# Patient Record
Sex: Male | Born: 1950
Health system: Southern US, Community
[De-identification: ages and names within clinical notes are randomized; demographics above are authoritative.]

## PROBLEM LIST (undated history)

## (undated) DIAGNOSIS — C61 Malignant neoplasm of prostate: Secondary | ICD-10-CM

## (undated) DIAGNOSIS — E7800 Pure hypercholesterolemia, unspecified: Secondary | ICD-10-CM

## (undated) DIAGNOSIS — F0390 Unspecified dementia without behavioral disturbance: Secondary | ICD-10-CM

## (undated) DIAGNOSIS — R972 Elevated prostate specific antigen [PSA]: Secondary | ICD-10-CM

## (undated) HISTORY — PX: PROSTATE BIOPSY: SHX241

## (undated) HISTORY — PX: TONSILLECTOMY: SUR1361

---

## 2000-08-18 ENCOUNTER — Ambulatory Visit (HOSPITAL_COMMUNITY): Admission: RE | Admit: 2000-08-18 | Discharge: 2000-08-18 | Payer: Self-pay | Admitting: Gastroenterology

## 2005-01-23 ENCOUNTER — Ambulatory Visit (HOSPITAL_BASED_OUTPATIENT_CLINIC_OR_DEPARTMENT_OTHER): Admission: RE | Admit: 2005-01-23 | Discharge: 2005-01-23 | Payer: Self-pay | Admitting: Surgery

## 2005-01-23 ENCOUNTER — Ambulatory Visit (HOSPITAL_COMMUNITY): Admission: RE | Admit: 2005-01-23 | Discharge: 2005-01-23 | Payer: Self-pay | Admitting: Surgery

## 2015-11-04 DIAGNOSIS — M25561 Pain in right knee: Secondary | ICD-10-CM | POA: Diagnosis not present

## 2016-01-15 DIAGNOSIS — E782 Mixed hyperlipidemia: Secondary | ICD-10-CM | POA: Diagnosis not present

## 2016-01-15 DIAGNOSIS — R7301 Impaired fasting glucose: Secondary | ICD-10-CM | POA: Diagnosis not present

## 2016-01-15 DIAGNOSIS — Z125 Encounter for screening for malignant neoplasm of prostate: Secondary | ICD-10-CM | POA: Diagnosis not present

## 2016-01-15 DIAGNOSIS — R05 Cough: Secondary | ICD-10-CM | POA: Diagnosis not present

## 2016-01-15 DIAGNOSIS — Z0000 Encounter for general adult medical examination without abnormal findings: Secondary | ICD-10-CM | POA: Diagnosis not present

## 2016-01-15 DIAGNOSIS — Z23 Encounter for immunization: Secondary | ICD-10-CM | POA: Diagnosis not present

## 2016-01-15 DIAGNOSIS — Z131 Encounter for screening for diabetes mellitus: Secondary | ICD-10-CM | POA: Diagnosis not present

## 2016-01-16 ENCOUNTER — Other Ambulatory Visit: Payer: Self-pay | Admitting: Physician Assistant

## 2016-01-16 DIAGNOSIS — Z87891 Personal history of nicotine dependence: Secondary | ICD-10-CM

## 2016-02-03 ENCOUNTER — Ambulatory Visit
Admission: RE | Admit: 2016-02-03 | Discharge: 2016-02-03 | Disposition: A | Discharge status: Home / Self Care | Payer: Medicare Other | Source: Ambulatory Visit | Attending: Physician Assistant | Admitting: Physician Assistant

## 2016-02-03 DIAGNOSIS — Z87891 Personal history of nicotine dependence: Secondary | ICD-10-CM | POA: Diagnosis not present

## 2016-02-03 DIAGNOSIS — Z136 Encounter for screening for cardiovascular disorders: Secondary | ICD-10-CM | POA: Diagnosis not present

## 2017-01-05 ENCOUNTER — Ambulatory Visit (INDEPENDENT_AMBULATORY_CARE_PROVIDER_SITE_OTHER): Payer: Medicare Other

## 2017-01-05 ENCOUNTER — Ambulatory Visit (INDEPENDENT_AMBULATORY_CARE_PROVIDER_SITE_OTHER): Payer: Medicare Other | Admitting: Orthopaedic Surgery

## 2017-01-05 ENCOUNTER — Encounter (INDEPENDENT_AMBULATORY_CARE_PROVIDER_SITE_OTHER): Payer: Self-pay | Admitting: Orthopaedic Surgery

## 2017-01-05 VITALS — BP 121/76 | HR 83 | Resp 14 | Ht 69.0 in | Wt 165.0 lb

## 2017-01-05 DIAGNOSIS — G8929 Other chronic pain: Secondary | ICD-10-CM

## 2017-01-05 DIAGNOSIS — M25562 Pain in left knee: Secondary | ICD-10-CM | POA: Diagnosis not present

## 2017-01-05 DIAGNOSIS — M25561 Pain in right knee: Secondary | ICD-10-CM

## 2017-01-05 MED ORDER — BUPIVACAINE HCL 0.5 % IJ SOLN
3.0000 mL | INTRAMUSCULAR | Status: AC | PRN
  Administered 2017-01-05: 3 mL via INTRA_ARTICULAR

## 2017-01-05 MED ORDER — METHYLPREDNISOLONE ACETATE 40 MG/ML IJ SUSP
80.0000 mg | INTRAMUSCULAR | Status: AC | PRN
  Administered 2017-01-05: 80 mg

## 2017-01-05 MED ORDER — LIDOCAINE HCL 1 % IJ SOLN
5.0000 mL | INTRAMUSCULAR | Status: AC | PRN
  Administered 2017-01-05: 5 mL

## 2017-01-05 NOTE — Progress Notes (Signed)
Office Visit Note   Patient: Brian Greene           Date of Birth: 07/20/1950           MRN: [id number] Visit Date: 01/05/2017              Requested by: No referring provider defined for this encounter. PCP: Orpah Melter, MD   Assessment & Plan: Visit Diagnoses:  1. Chronic pain of left knee   the plate could be a combination of early osteoarthritis medial compartment left knee versus combination of arthritis and tear of the medial meniscus  Plan: long discussion regarding different diagnostic possibilities. Cortisone injection medial compartment left knee and monitor  Response.if no improvement consider MRI scan  Follow-Up Instructions: No Follow-up on file.   Orders:  Orders Placed This Encounter  Procedures  . XR KNEE 3 VIEW LEFT   No orders of the defined types were placed in this encounter.     Procedures: Large Joint Inj Date/Time: 01/05/2017 5:31 PM Performed by: Garald Balding Authorized by: Garald Balding   Consent Given by:  Patient Timeout: prior to procedure the correct patient, procedure, and site was verified   Indications:  Pain and joint swelling Location:  Knee Site:  L knee Prep: patient was prepped and draped in usual sterile fashion   Needle Size:  25 G Needle Length:  1.5 inches Approach:  Anteromedial Ultrasound Guidance: No   Fluoroscopic Guidance: No   Arthrogram: No   Medications:  5 mL lidocaine 1 %; 80 mg methylPREDNISolone acetate 40 MG/ML; 3 mL bupivacaine 0.5 % Aspiration Attempted: No   Patient tolerance:  Patient tolerated the procedure well with no immediate complications     Clinical Data: No additional findings.   Subjective: Chief Complaint  Patient presents with  . Left Knee - Injury, Pain, Edema    Brian Greene is a 66 y o that presents with Left knee pain. He relates since went off a diving board July 5,2018 he has been in pain.swollen, slightly red  initial onset of pain after a twisting injury. He's  had persistent pain for the past 2 to a half months predominantly along the medial compartment of his left knee. He's been experiencing achiness soreness and even an effusion. He's had a limp and some compromise of his activities particularly with bending and stooping. He's not had any locking. Has not had any back pain or groin discomfort. No numbness or tingling.  HPI  Review of Systems  Constitutional: Negative for fatigue.  HENT: Negative for hearing loss.   Respiratory: Positive for cough. Negative for apnea, chest tightness and shortness of breath.   Cardiovascular: Negative for chest pain, palpitations and leg swelling.  Gastrointestinal: Negative for blood in stool, constipation and diarrhea.  Genitourinary: Negative for difficulty urinating.  Musculoskeletal: Negative for arthralgias, back pain, joint swelling, myalgias, neck pain and neck stiffness.  Neurological: Negative for weakness, numbness and headaches.  Hematological: Does not bruise/bleed easily.  Psychiatric/Behavioral: Negative for sleep disturbance. The patient is not nervous/anxious.      Objective: Vital Signs: BP 121/76   Pulse 83   Resp 14   Ht 5\' 9"  (1.753 m)   Wt 165 lb (74.8 kg)   BMI 24.37 kg/m   Physical Exam  Ortho Examawake alert and oriented 3 comfortable sitting position positive effusion left knee with predominantly medial joint pain. Pain is diffuse along the lateral compartment but predominantly posteriorly. No patella crepitation. No lateral  joint pain. No instability. No popliteal mass or pain. No calf pain. No distal edema. Neurovascular exam intact. Straight leg raise negative bilaterally. Painless rof motion both hips.  Specialty Comments:  No specialty comments available.  Imaging: Xr Knee 3 View Left  Result Date: 01/05/2017 Films of the left knee were obtained in several projections standing. There is minimal decrease in the medial joint space. No subchondral sclerosis. Minimal  osteophyte formation. Some patellofemoral arthritis but very minimal. No ectopic calcification. Normal alignment.    PMFS History: There are no active problems to display for this patient.  History reviewed. No pertinent past medical history.  Family History  Problem Relation Age of Onset  . Diabetes Mother     Past Surgical History:  Procedure Laterality Date  . TONSILLECTOMY     Social History   Occupational History  . Not on file.   Social History Main Topics  . Smoking status: Never Smoker  . Smokeless tobacco: Never Used  . Alcohol use Yes     Comment: occasionally  . Drug use: No  . Sexual activity: Not on file

## 2017-01-06 ENCOUNTER — Ambulatory Visit (INDEPENDENT_AMBULATORY_CARE_PROVIDER_SITE_OTHER): Payer: Self-pay | Admitting: Orthopedic Surgery

## 2017-02-19 ENCOUNTER — Telehealth: Payer: Self-pay

## 2017-02-19 NOTE — Telephone Encounter (Signed)
Patient's wife Melody called stating that patient would like to proceed with having an MRI done of his Left knee, due to injection not working.  CB# [phone].  Please advise. Thank You

## 2017-02-23 ENCOUNTER — Other Ambulatory Visit (INDEPENDENT_AMBULATORY_CARE_PROVIDER_SITE_OTHER): Payer: Self-pay

## 2017-02-23 DIAGNOSIS — M25562 Pain in left knee: Principal | ICD-10-CM

## 2017-02-23 DIAGNOSIS — G8929 Other chronic pain: Secondary | ICD-10-CM

## 2017-02-23 NOTE — Telephone Encounter (Signed)
sent referral

## 2017-03-07 ENCOUNTER — Ambulatory Visit
Admission: RE | Admit: 2017-03-07 | Discharge: 2017-03-07 | Disposition: A | Discharge status: Home / Self Care | Payer: Medicare Other | Source: Ambulatory Visit | Attending: Orthopaedic Surgery | Admitting: Orthopaedic Surgery

## 2017-03-07 DIAGNOSIS — M25562 Pain in left knee: Principal | ICD-10-CM

## 2017-03-07 DIAGNOSIS — G8929 Other chronic pain: Secondary | ICD-10-CM

## 2017-03-16 ENCOUNTER — Telehealth: Payer: Self-pay

## 2017-03-16 NOTE — Telephone Encounter (Signed)
Called results of MRI left knee

## 2017-03-16 NOTE — Telephone Encounter (Signed)
Please call.

## 2017-03-16 NOTE — Telephone Encounter (Signed)
Patient's wife Melody  would like a phone call concerning MRI results.  Cb# is [phone].  Please advise.  Thank you.

## 2018-04-04 ENCOUNTER — Ambulatory Visit: Payer: Medicare Other | Admitting: Podiatry

## 2018-04-04 ENCOUNTER — Encounter: Payer: Self-pay | Admitting: Podiatry

## 2018-04-04 VITALS — BP 136/81 | HR 63 | Resp 16

## 2018-04-04 DIAGNOSIS — L600 Ingrowing nail: Secondary | ICD-10-CM | POA: Diagnosis not present

## 2018-04-04 MED ORDER — MUPIROCIN 2 % EX OINT: 1.0000 "application " | TOPICAL_OINTMENT | Freq: Two times a day (BID) | CUTANEOUS | 2 refills | Status: DC

## 2018-04-04 NOTE — Patient Instructions (Signed)

## 2018-04-10 DIAGNOSIS — L600 Ingrowing nail: Secondary | ICD-10-CM | POA: Insufficient documentation

## 2018-04-10 NOTE — Progress Notes (Signed)
Subjective:   Patient ID: Brian Greene, male   DOB: 67 y.o.   MRN: [id number]   HPI 67 year old male presents the office today for concerns of ingrown toenails of the right big toe, lateral aspect which is ongoing for last couple of months is intermittent and pain.  He states that he had an injury to the nail about when he was 67 years old and he continues to get ingrown toenail since then he has a clip his toenails but does not limit the pain long-term.  Denies any drainage or pus at this time he has no other concerns.   Review of Systems  All other systems reviewed and are negative.  No past medical history on file.     Current Outpatient Medications:  .  mupirocin ointment (BACTROBAN) 2 %, Apply 1 application topically 2 (two) times daily., Disp: 30 g, Rfl: 2  Allergies  Allergen Reactions  . Penicillins Rash         Objective:  Physical Exam  General: AAO x3, NAD  Dermatological: Incurvation present the lateral aspect the right hallux toenail with localized edema to the area but there is no drainage of pus and there is no ascending cellulitis.  No fluctuation or crepitation.  Vascular: Dorsalis Pedis artery and Posterior Tibial artery pedal pulses are 2/4 bilateral with immedate capillary fill time. There is no pain with calf compression, swelling, warmth, erythema.   Neruologic: Grossly intact via light touch bilateral.. Protective threshold with Semmes Wienstein monofilament intact to all pedal sites bilateral.   Musculoskeletal: No gross boney pedal deformities bilateral. No pain, crepitus, or limitation noted with foot and ankle range of motion bilateral. Muscular strength 5/5 in all groups tested bilateral.  Gait: Unassisted, Nonantalgic.   Assessment:   Ingrown toenail right lateral hallux     Plan:  -Treatment options discussed including all alternatives, risks, and complications -Etiology of symptoms were discussed -At this time, the patient is requesting  partial nail removal with chemical matricectomy to the symptomatic portion of the nail. Risks and complications were discussed with the patient for which they understand and written consent was obtained. Under sterile conditions a total of 3 mL of a mixture of 2% lidocaine plain and 0.5% Marcaine plain was infiltrated in a hallux block fashion. Once anesthetized, the skin was prepped in sterile fashion. A tourniquet was then applied. Next the lateral aspect of hallux nail border was then sharply excised making sure to remove the entire offending nail border. Once the nails were ensured to be removed area was debrided and the underlying skin was intact. There is no purulence identified in the procedure. Next phenol was then applied under standard conditions and copiously irrigated. Silvadene was applied. A dry sterile dressing was applied. After application of the dressing the tourniquet was removed and there is found to be an immediate capillary refill time to the digit. The patient tolerated the procedure well any complications. Post procedure instructions were discussed the patient for which he verbally understood. Follow-up in one week for nail check or sooner if any problems are to arise. Discussed signs/symptoms of infection and directed to call the office immediately should any occur or go directly to the emergency room. In the meantime, encouraged to call the office with any questions, concerns, changes symptoms.  Trula Slade DPM

## 2018-04-11 ENCOUNTER — Ambulatory Visit (INDEPENDENT_AMBULATORY_CARE_PROVIDER_SITE_OTHER): Payer: Medicare Other | Admitting: Podiatry

## 2018-04-11 ENCOUNTER — Other Ambulatory Visit: Payer: Medicare Other

## 2018-04-11 DIAGNOSIS — L600 Ingrowing nail: Secondary | ICD-10-CM

## 2018-04-11 NOTE — Patient Instructions (Signed)

## 2018-04-19 NOTE — Progress Notes (Signed)
Subjective: m Brian Greene is a 67 y.o.  male returns to office today for follow up evaluation after having right hallux partial permanent nail avulsion performed.  He was soaking Epsom salts but is not in presents as regularly as he should.  He states he is doing great he is having no pain denies any drainage or pus.  Patient denies fevers, chills, nausea, vomiting. Denies any calf pain, chest pain, SOB.   Objective:  Vitals: Reviewed  General: Well developed, nourished, in no acute distress, alert and oriented x3   Dermatology: Skin is warm, dry and supple bilateral.  Right pleural hallux nail border appears to be clean, dry, with mild granular tissue and surrounding scab. There is no surrounding erythema, edema, drainage/purulence. The remaining nails appear unremarkable at this time. There are no other lesions or other signs of infection present.  Neurovascular status: Intact. No lower extremity swelling; No pain with calf compression bilateral.  Musculoskeletal: Notenderness to palpation of the lateral hallux nail fold. Muscular strength within normal limits bilateral.   Assesement and Plan: S/p partial nail avulsion, doing well.   -Continue soaking in epsom salts twice a day followed by antibiotic ointment and a band-aid. Can leave uncovered at night. Continue this until completely healed.  -If the area has not healed in 2 weeks, call the office for follow-up appointment, or sooner if any problems arise.  -Monitor for any signs/symptoms of infection. Call the office immediately if any occur or go directly to the emergency room. Call with any questions/concerns.  Brian Greene, DPM

## 2019-01-27 ENCOUNTER — Other Ambulatory Visit: Payer: Self-pay | Admitting: Nurse Practitioner

## 2019-01-27 DIAGNOSIS — I700 Atherosclerosis of aorta: Secondary | ICD-10-CM

## 2019-02-02 ENCOUNTER — Other Ambulatory Visit: Payer: Self-pay | Admitting: Nurse Practitioner

## 2019-02-02 ENCOUNTER — Ambulatory Visit
Admission: RE | Admit: 2019-02-02 | Discharge: 2019-02-02 | Disposition: A | Discharge status: Home / Self Care | Payer: Medicare Other | Source: Ambulatory Visit | Attending: Nurse Practitioner | Admitting: Nurse Practitioner

## 2019-02-02 DIAGNOSIS — I700 Atherosclerosis of aorta: Secondary | ICD-10-CM

## 2019-05-22 ENCOUNTER — Encounter: Payer: Self-pay | Admitting: *Deleted

## 2019-06-02 ENCOUNTER — Ambulatory Visit: Payer: Medicare Other | Admitting: Radiation Oncology

## 2019-06-02 ENCOUNTER — Other Ambulatory Visit: Payer: Self-pay | Admitting: Urology

## 2019-06-21 NOTE — Patient Instructions (Addendum)
DUE TO COVID-19 ONLY ONE VISITOR IS ALLOWED TO COME WITH YOU AND STAY IN THE WAITING ROOM ONLY DURING PRE OP AND PROCEDURE DAY OF SURGERY. THE 1 VISITOR MAY VISIT WITH YOU AFTER SURGERY IN YOUR PRIVATE ROOM DURING VISITING HOURS ONLY!  YOU NEED TO HAVE A COVID 19 TEST ON: 06/26/19 @ 2:45 pm , THIS TEST MUST BE DONE BEFORE SURGERY, COME  [address], [place] , [address].  ([place]) ONCE YOUR COVID TEST IS COMPLETED, PLEASE BEGIN THE QUARANTINE INSTRUCTIONS AS OUTLINED IN YOUR HANDOUT.                JONNATAN APPLEBY    Your procedure is scheduled on: 06/29/19   Report to [facility] Main  Entrance   Report to admitting: at 9:15 AM     Call this number if you have problems the morning of surgery [phone]    Remember:    [place], NO [place].     Take these medicines the morning of surgery with A SIP OF WATER:  N/A                You may not have any metal on your body including hair pins and              piercings  Do not wear jewelry, lotions, powders or perfumes, deodorant             Men may shave face and neck.   Do not bring valuables to the hospital. [place].  Contacts, dentures or bridgework may not be worn into surgery.  Leave suitcase in the car. After surgery it may be brought to your room.     Patients discharged the day of surgery will not be allowed to drive home. IF YOU ARE HAVING SURGERY AND GOING HOME THE SAME DAY, YOU MUST HAVE AN ADULT TO DRIVE YOU HOME AND BE WITH YOU FOR 24 HOURS. YOU MAY GO HOME BY TAXI OR UBER OR ORTHERWISE, BUT AN ADULT MUST ACCOMPANY YOU HOME AND STAY WITH YOU FOR 24 HOURS.  Name and phone number of your driver:  Special Instructions: N/A              Please read over the following fact sheets you were  given: _____________________________________________________________________             NO SOLID FOOD AFTER MIDNIGHT THE NIGHT PRIOR TO SURGERY. NOTHING BY MOUTH EXCEPT CLEAR LIQUIDS UNTIL: 8:00 am .   CLEAR LIQUID DIET   Foods Allowed                                                                     Foods Excluded  Coffee and tea, regular and decaf                             liquids that you cannot  Plain Jell-O any favor except red or purple  see through such as: Fruit ices (not with fruit pulp)                                     milk, soups, orange juice  Iced Popsicles                                    All solid food Carbonated beverages, regular and diet                                    Cranberry, grape and apple juices Sports drinks like Gatorade Lightly seasoned clear broth or consume(fat free) Sugar, honey syrup  Sample Menu Breakfast                                Lunch                                     Supper Cranberry juice                    Beef broth                            Chicken broth Jell-O                                     Grape juice                           Apple juice Coffee or tea                        Jell-O                                      Popsicle                                                Coffee or tea                        Coffee or tea  _____________________________________________________________________  [facility] - Preparing for Surgery Before surgery, you can play an important role.  Because skin is not sterile, your skin needs to be as free of germs as possible.  You can reduce the number of germs on your skin by washing with CHG (chlorahexidine gluconate) soap before surgery.  CHG is an antiseptic cleaner which kills germs and bonds with the skin to continue killing germs even after washing. Please DO NOT use if you have an allergy to CHG or antibacterial soaps.  If your skin  becomes reddened/irritated stop using the CHG and inform your nurse when you arrive at Short Stay. Do not shave (including legs and underarms)  for at least 48 hours prior to the first CHG shower.  You may shave your face/neck. Please follow these instructions carefully:  1.  Shower with CHG Soap the night before surgery and the  morning of Surgery.  2.  If you choose to wash your hair, wash your hair first as usual with your  normal  shampoo.  3.  After you shampoo, rinse your hair and body thoroughly to remove the  shampoo.                           4.  Use CHG as you would any other liquid soap.  You can apply chg directly  to the skin and wash                       Gently with a scrungie or clean washcloth.  5.  Apply the CHG Soap to your body ONLY FROM THE NECK DOWN.   Do not use on face/ open                           Wound or open sores. Avoid contact with eyes, ears mouth and genitals (private parts).                       Wash face,  Genitals (private parts) with your normal soap.             6.  Wash thoroughly, paying special attention to the area where your surgery  will be performed.  7.  Thoroughly rinse your body with warm water from the neck down.  8.  DO NOT shower/wash with your normal soap after using and rinsing off  the CHG Soap.                9.  Pat yourself dry with a clean towel.            10.  Wear clean pajamas.            11.  Place clean sheets on your bed the night of your first shower and do not  sleep with pets. Day of Surgery : Do not apply any lotions/deodorants the morning of surgery.  Please wear clean clothes to the hospital/surgery center.  FAILURE TO FOLLOW THESE INSTRUCTIONS MAY RESULT IN THE CANCELLATION OF YOUR SURGERY PATIENT SIGNATURE_________________________________  NURSE SIGNATURE__________________________________  ________________________________________________________________________   Adam Phenix  An incentive spirometer is a  tool that can help keep your lungs clear and active. This tool measures how well you are filling your lungs with each breath. Taking long deep breaths may help reverse or decrease the chance of developing breathing (pulmonary) problems (especially infection) following:  A long period of time when you are unable to move or be active. BEFORE THE PROCEDURE   If the spirometer includes an indicator to show your best effort, your nurse or respiratory therapist will set it to a desired goal.  If possible, sit up straight or lean slightly forward. Try not to slouch.  Hold the incentive spirometer in an upright position. INSTRUCTIONS FOR USE  1. Sit on the edge of your bed if possible, or sit up as far as you can in bed or on a chair. 2. Hold the incentive spirometer in an upright position. 3. Breathe out normally. 4. Place the mouthpiece in your mouth and seal your lips tightly  around it. 5. Breathe in slowly and as deeply as possible, raising the piston or the ball toward the top of the column. 6. Hold your breath for 3-5 seconds or for as long as possible. Allow the piston or ball to fall to the bottom of the column. 7. Remove the mouthpiece from your mouth and breathe out normally. 8. Rest for a few seconds and repeat Steps 1 through 7 at least 10 times every 1-2 hours when you are awake. Take your time and take a few normal breaths between deep breaths. 9. The spirometer may include an indicator to show your best effort. Use the indicator as a goal to work toward during each repetition. 10. After each set of 10 deep breaths, practice coughing to be sure your lungs are clear. If you have an incision (the cut made at the time of surgery), support your incision when coughing by placing a pillow or rolled up towels firmly against it. Once you are able to get out of bed, walk around indoors and cough well. You may stop using the incentive spirometer when instructed by your caregiver.  RISKS AND  COMPLICATIONS  Take your time so you do not get dizzy or light-headed.  If you are in pain, you may need to take or ask for pain medication before doing incentive spirometry. It is harder to take a deep breath if you are having pain. AFTER USE  Rest and breathe slowly and easily.  It can be helpful to keep track of a log of your progress. Your caregiver can provide you with a simple table to help with this. If you are using the spirometer at home, follow these instructions: [place] IF:   You are having difficultly using the spirometer.  You have trouble using the spirometer as often as instructed.  Your pain medication is not giving enough relief while using the spirometer.  You develop fever of 100.5 F (38.1 C) or higher. SEEK IMMEDIATE MEDICAL CARE IF:   You cough up bloody sputum that had not been present before.  You develop fever of 102 F (38.9 C) or greater.  You develop worsening pain at or near the incision site. MAKE SURE YOU:   Understand these instructions.  Will watch your condition.  Will get help right away if you are not doing well or get worse. Document Released: 08/17/2006 Document Revised: 06/29/2011 Document Reviewed: 10/18/2006 [facility] Patient Information 2014 [place], [place].   ________________________________________________________________________

## 2019-06-23 ENCOUNTER — Other Ambulatory Visit: Payer: Self-pay

## 2019-06-23 ENCOUNTER — Encounter (HOSPITAL_COMMUNITY)
Admission: RE | Admit: 2019-06-23 | Discharge: 2019-06-23 | Disposition: A | Discharge status: Home / Self Care | Payer: Medicare Other | Source: Ambulatory Visit | Attending: Urology | Admitting: Urology

## 2019-06-23 ENCOUNTER — Encounter (HOSPITAL_COMMUNITY): Payer: Self-pay

## 2019-06-23 DIAGNOSIS — Z20822 Contact with and (suspected) exposure to covid-19: Secondary | ICD-10-CM | POA: Diagnosis not present

## 2019-06-23 DIAGNOSIS — Z01812 Encounter for preprocedural laboratory examination: Secondary | ICD-10-CM | POA: Diagnosis present

## 2019-06-23 NOTE — Progress Notes (Signed)
RN attempted to call pt. Several times,pt. Did not answered.Message was left on answering machine.

## 2019-06-23 NOTE — Progress Notes (Signed)
PCP - Dr. Sammuel Cooper Cardiologist -   Chest x-ray -  EKG -  Stress Test -  ECHO -  Cardiac Cath -   Sleep Study -  CPAP -   Fasting Blood Sugar -  Checks Blood Sugar _____ times a day  Blood Thinner Instructions: Aspirin Instructions: Last Dose:  Anesthesia review:   Patient denies shortness of breath, fever, cough and chest pain at PAT appointment   Patient verbalized understanding of instructions that were given to them at the PAT appointment. Patient was also instructed that they will need to review over the PAT instructions again at home before surgery.

## 2019-06-26 ENCOUNTER — Encounter (HOSPITAL_COMMUNITY)
Admission: RE | Admit: 2019-06-26 | Discharge: 2019-06-26 | Disposition: A | Discharge status: Home / Self Care | Payer: Medicare Other | Source: Ambulatory Visit | Attending: Urology | Admitting: Urology

## 2019-06-26 ENCOUNTER — Other Ambulatory Visit (HOSPITAL_COMMUNITY)
Admission: RE | Admit: 2019-06-26 | Discharge: 2019-06-26 | Disposition: A | Discharge status: Home / Self Care | Payer: Medicare Other | Source: Ambulatory Visit | Attending: Urology | Admitting: Urology

## 2019-06-26 ENCOUNTER — Other Ambulatory Visit: Payer: Self-pay

## 2019-06-26 DIAGNOSIS — Z01812 Encounter for preprocedural laboratory examination: Secondary | ICD-10-CM | POA: Diagnosis not present

## 2019-06-26 LAB — BASIC METABOLIC PANEL
Anion gap: 6 (ref 5–15)
BUN: 11 mg/dL (ref 8–23)
CO2: 29 mmol/L (ref 22–32)
Calcium: 9.1 mg/dL (ref 8.9–10.3)
Chloride: 107 mmol/L (ref 98–111)
Creatinine, Ser: 1.08 mg/dL (ref 0.61–1.24)
GFR calc Af Amer: >60 mL/min (ref >60)
GFR calc non Af Amer: >60 mL/min (ref >60)
Glucose, Bld: 116 mg/dL — ABNORMAL HIGH (ref 70–99)
Potassium: 4.1 mmol/L (ref 3.5–5.1)
Sodium: 142 mmol/L (ref 135–145)

## 2019-06-26 LAB — CBC
HCT: 43.9 % (ref 39.0–52.0)
Hemoglobin: 14.3 g/dL (ref 13.0–17.0)
MCH: 29.7 pg (ref 26.0–34.0)
MCHC: 32.6 g/dL (ref 30.0–36.0)
MCV: 91.1 fL (ref 80.0–100.0)
Platelets: 233 10*3/uL (ref 150–400)
RBC: 4.82 MIL/uL (ref 4.22–5.81)
RDW: 12.5 % (ref 11.5–15.5)
WBC: 6.3 10*3/uL (ref 4.0–10.5)
nRBC: 0 % (ref 0.0–0.2)

## 2019-06-26 LAB — SARS CORONAVIRUS 2 (TAT 6-24 HRS): SARS Coronavirus 2: NEGATIVE

## 2019-06-26 LAB — ABO/RH: ABO/RH(D): O POS

## 2019-06-28 NOTE — H&P (Signed)
Prostate Cancer    Brian Greene is a 69 year old healthy gentleman who was found to have an elevated PSA of 5.06 prompting a TRUS biopsy of the prostate on 04/19/20. This demonstrated Gleason 3+4=7 adenocarcinoma of the prostate with 6 out of 12 biopsy cores positive for malignancy.   Family history: Uncle.   Imaging studies: None.   PMH: He has a history of hyperlipidemia.  PSH: No abdominal surgeries.   TNM stage: cT2a Nx Mx (right base firmness)  PSA: 5.06  Gleason score: 3+4=7 (Grade group 2)  Biopsy (04/19/20): 6/12 cores positive  Left: L lateral apex (10%, 3+3=6), L apex (40%, 3+4=7)  Right: R apex (40%, 3+3=6), R mid (20%, 3+4=7), R lateral mid (40%, 3+4=7), R lateral base (50%, 3+4=7)  Prostate volume: 35.8 cc   Nomogram  OC disease: 55%  EPE: 43%  SVI: 5%  LNI: 4%  PFS (5 year, 10 year): 84%, 74%   Urinary function: IPSS is 14.  Erectile function: SHIM score is 12. He does have fairly significant erectile dysfunction. He has not attempted therapy with oral medications. He and his wife have decided that this is a lower priority although he cannot completely rule out the possibility that he may wish to consider treatment in the future.     ALLERGIES: penicillin    MEDICATIONS: None   GU PSH: Prostate Needle Biopsy - 04/20/2019     NON-GU PSH: Surgical Pathology, Gross And Microscopic Examination For Prostate Needle - 04/20/2019 Tonsillectomy     GU PMH: ED due to arterial insufficiency, He has a SHIM of 12 which is consistent with moderate ED. - 05/17/2019 Prostate Cancer, T2c Nx Mx Gleason 7(3+4) favorable intermediate risk prostate cancer. He is not a good candidate for AS, but is a good candidate for RALP, Seeds or EXRT with/without ADT. I am going to get him set up with the [facility] for further discussion. - 05/17/2019 Elevated PSA, He has a rising PSA and some grittiness at the right base. I am going to get him set up for a prostate [place] and biopsy and reviewed the  risks of bleeding, infection and voiding difficulty. Levaquin sent for the prep. - 02/27/2019 Prostate nodule w/ LUTS - 02/27/2019 Urinary Frequency - 02/27/2019    NON-GU PMH: Hypercholesterolemia    FAMILY HISTORY: Alzheimer's Disease - Father, Mother Kidney Stones - Brother Prostate Cancer - Uncle   SOCIAL HISTORY: Marital Status: Married Preferred Language: English; Race: White Current Smoking Status: Patient does not smoke anymore.   Tobacco Use Assessment Completed: Used Tobacco in last 30 days? Drinks 3 drinks per month.  Drinks 4+ caffeinated drinks per day. 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     Notes: 1 daughter    REVIEW OF SYSTEMS:    GU Review Male:   Patient denies frequent urination, hard to postpone urination, burning/ pain with urination, get up at night to urinate, leakage of urine, stream starts and stops, trouble starting your streams, and have to strain to urinate .  Gastrointestinal (Lower):   Patient denies diarrhea and constipation.  Gastrointestinal (Upper):   Patient denies nausea and vomiting.  Constitutional:   Patient denies fever, night sweats, weight loss, and fatigue.  Skin:   Patient denies skin rash/ lesion and itching.  Eyes:   Patient denies blurred vision and double vision.  Ears/ Nose/ Throat:   Patient denies sore throat and sinus problems.  Hematologic/Lymphatic:   Patient denies swollen glands and easy bruising.  Cardiovascular:   Patient denies leg swelling and chest pains.  Respiratory:   Patient denies cough and shortness of breath.  Endocrine:   Patient denies excessive thirst.  Musculoskeletal:   Patient denies back pain and joint pain.  Neurological:   Patient denies headaches and dizziness.  Psychologic:   Patient denies depression and anxiety.   VITAL SIGNS:     Weight 150 lb / 68.04 kg  Height 68 in / 172.72 BMI 22.8 kg/m    MULTI-SYSTEM PHYSICAL EXAMINATION:    Constitutional: Well-nourished. No  physical deformities. Normally developed. Good grooming.  Respiratory: No labored breathing, no use of accessory muscles. Clear bilaterally.  Cardiovascular: Normal temperature, normal extremity pulses, no swelling, no varicosities. Regular rate and rhythm.        ASSESSMENT:      ICD-10 Details  1 GU:   Prostate Cancer - C61    PLAN:        1. Favorable intermediate risk prostate cancer:   He does adamantly wished to proceed with surgical treatment of his prostate cancer. He will be scheduled for a bilateral nerve-sparing robot assisted laparoscopic radical prostatectomy and pelvic lymphadenectomy.

## 2019-06-29 ENCOUNTER — Other Ambulatory Visit: Payer: Self-pay

## 2019-06-29 ENCOUNTER — Observation Stay (HOSPITAL_COMMUNITY)
Admission: RE | Admit: 2019-06-29 | Discharge: 2019-06-30 | Disposition: A | Discharge status: Home / Self Care | Payer: Medicare Other | Attending: Urology | Admitting: Urology

## 2019-06-29 ENCOUNTER — Ambulatory Visit (HOSPITAL_COMMUNITY): Payer: Medicare Other | Admitting: Anesthesiology

## 2019-06-29 ENCOUNTER — Encounter (HOSPITAL_COMMUNITY): Payer: Self-pay | Admitting: Urology

## 2019-06-29 ENCOUNTER — Encounter (HOSPITAL_COMMUNITY)
Admission: RE | Disposition: A | Discharge status: Home / Self Care | Payer: Self-pay | Source: Home / Self Care | Attending: Urology

## 2019-06-29 DIAGNOSIS — C61 Malignant neoplasm of prostate: Secondary | ICD-10-CM | POA: Diagnosis present

## 2019-06-29 DIAGNOSIS — Z880 Allergy status to penicillin: Secondary | ICD-10-CM | POA: Insufficient documentation

## 2019-06-29 DIAGNOSIS — Z87891 Personal history of nicotine dependence: Secondary | ICD-10-CM | POA: Diagnosis not present

## 2019-06-29 DIAGNOSIS — N5201 Erectile dysfunction due to arterial insufficiency: Secondary | ICD-10-CM | POA: Insufficient documentation

## 2019-06-29 HISTORY — PX: LYMPHADENECTOMY: SHX5960

## 2019-06-29 HISTORY — PX: ROBOT ASSISTED LAPAROSCOPIC RADICAL PROSTATECTOMY: SHX5141

## 2019-06-29 LAB — HEMOGLOBIN AND HEMATOCRIT, BLOOD
HCT: 45 % (ref 39.0–52.0)
Hemoglobin: 14.6 g/dL (ref 13.0–17.0)

## 2019-06-29 LAB — TYPE AND SCREEN
ABO/RH(D): O POS
Antibody Screen: NEGATIVE

## 2019-06-29 SURGERY — XI ROBOTIC ASSISTED LAPAROSCOPIC RADICAL PROSTATECTOMY LEVEL 2
Anesthesia: General

## 2019-06-29 MED ORDER — KETOROLAC TROMETHAMINE 30 MG/ML IJ SOLN
INTRAMUSCULAR | Status: AC
  Filled 2019-06-29: qty 1

## 2019-06-29 MED ORDER — KCL IN DEXTROSE-NACL 20-5-0.45 MEQ/L-%-% IV SOLN
INTRAVENOUS | Status: DC
  Filled 2019-06-29 (×2): qty 1000

## 2019-06-29 MED ORDER — SODIUM CHLORIDE 0.9 % IR SOLN
Status: DC | PRN
  Administered 2019-06-29: 1000 mL via INTRAVESICAL

## 2019-06-29 MED ORDER — ONDANSETRON HCL 4 MG/2ML IJ SOLN
INTRAMUSCULAR | Status: DC | PRN
  Administered 2019-06-29: 4 mg via INTRAVENOUS

## 2019-06-29 MED ORDER — HYDROMORPHONE HCL 1 MG/ML IJ SOLN
0.2500 mg | INTRAMUSCULAR | Status: DC | PRN
  Administered 2019-06-29: 0.5 mg via INTRAVENOUS

## 2019-06-29 MED ORDER — CEFAZOLIN SODIUM-DEXTROSE 2-4 GM/100ML-% IV SOLN
2.0000 g | Freq: Once | INTRAVENOUS | Status: AC
  Administered 2019-06-29: 2 g via INTRAVENOUS
  Filled 2019-06-29: qty 100

## 2019-06-29 MED ORDER — BUPIVACAINE-EPINEPHRINE (PF) 0.25% -1:200000 IJ SOLN
INTRAMUSCULAR | Status: AC
  Filled 2019-06-29: qty 30

## 2019-06-29 MED ORDER — ZOLPIDEM TARTRATE 5 MG PO TABS: 5.0000 mg | ORAL_TABLET | Freq: Every evening | ORAL | Status: DC | PRN

## 2019-06-29 MED ORDER — HYDROMORPHONE HCL 1 MG/ML IJ SOLN
INTRAMUSCULAR | Status: DC | PRN
  Administered 2019-06-29 (×5): .4 mg via INTRAVENOUS

## 2019-06-29 MED ORDER — BELLADONNA ALKALOIDS-OPIUM 16.2-60 MG RE SUPP: 1.0000 | Freq: Four times a day (QID) | RECTAL | Status: DC | PRN

## 2019-06-29 MED ORDER — DIPHENHYDRAMINE HCL 50 MG/ML IJ SOLN: 12.5000 mg | Freq: Four times a day (QID) | INTRAMUSCULAR | Status: DC | PRN

## 2019-06-29 MED ORDER — ACETAMINOPHEN 325 MG PO TABS: 650.0000 mg | ORAL_TABLET | ORAL | Status: DC | PRN

## 2019-06-29 MED ORDER — SUFENTANIL CITRATE 50 MCG/ML IV SOLN
INTRAVENOUS | Status: DC | PRN
  Administered 2019-06-29 (×3): 10 ug via INTRAVENOUS
  Administered 2019-06-29: 20 ug via INTRAVENOUS

## 2019-06-29 MED ORDER — BACITRACIN-NEOMYCIN-POLYMYXIN 400-5-5000 EX OINT: 1.0000 "application " | TOPICAL_OINTMENT | Freq: Three times a day (TID) | CUTANEOUS | Status: DC | PRN

## 2019-06-29 MED ORDER — BUPIVACAINE-EPINEPHRINE (PF) 0.25% -1:200000 IJ SOLN
INTRAMUSCULAR | Status: DC | PRN
  Administered 2019-06-29: 30 mL via PERINEURAL

## 2019-06-29 MED ORDER — LIDOCAINE 2% (20 MG/ML) 5 ML SYRINGE
INTRAMUSCULAR | Status: DC | PRN
  Administered 2019-06-29: 100 mg via INTRAVENOUS

## 2019-06-29 MED ORDER — MAGNESIUM CITRATE PO SOLN: 1.0000 | Freq: Once | ORAL | Status: DC

## 2019-06-29 MED ORDER — SULFAMETHOXAZOLE-TRIMETHOPRIM 800-160 MG PO TABS: 1.0000 | ORAL_TABLET | Freq: Two times a day (BID) | ORAL | 0 refills | Status: DC

## 2019-06-29 MED ORDER — ROCURONIUM BROMIDE 10 MG/ML (PF) SYRINGE
PREFILLED_SYRINGE | INTRAVENOUS | Status: DC | PRN
  Administered 2019-06-29: 20 mg via INTRAVENOUS
  Administered 2019-06-29: 70 mg via INTRAVENOUS

## 2019-06-29 MED ORDER — CEFAZOLIN SODIUM-DEXTROSE 1-4 GM/50ML-% IV SOLN
1.0000 g | Freq: Three times a day (TID) | INTRAVENOUS | Status: AC
  Administered 2019-06-29 – 2019-06-30 (×2): 1 g via INTRAVENOUS
  Filled 2019-06-29 (×2): qty 50

## 2019-06-29 MED ORDER — FLEET ENEMA 7-19 GM/118ML RE ENEM: 1.0000 | ENEMA | Freq: Once | RECTAL | Status: DC

## 2019-06-29 MED ORDER — MORPHINE SULFATE (PF) 4 MG/ML IV SOLN: 2.0000 mg | INTRAVENOUS | Status: DC | PRN

## 2019-06-29 MED ORDER — HYDROMORPHONE HCL 1 MG/ML IJ SOLN
INTRAMUSCULAR | Status: AC
  Filled 2019-06-29: qty 1

## 2019-06-29 MED ORDER — LACTATED RINGERS IV SOLN: INTRAVENOUS | Status: DC

## 2019-06-29 MED ORDER — HYDROMORPHONE HCL 2 MG/ML IJ SOLN
INTRAMUSCULAR | Status: AC
  Filled 2019-06-29: qty 1

## 2019-06-29 MED ORDER — SODIUM CHLORIDE (PF) 0.9 % IJ SOLN
INTRAMUSCULAR | Status: AC
  Filled 2019-06-29: qty 10

## 2019-06-29 MED ORDER — KETOROLAC TROMETHAMINE 15 MG/ML IJ SOLN
15.0000 mg | Freq: Four times a day (QID) | INTRAMUSCULAR | Status: DC
  Administered 2019-06-29 – 2019-06-30 (×3): 15 mg via INTRAVENOUS
  Filled 2019-06-29 (×3): qty 1

## 2019-06-29 MED ORDER — HEPARIN SODIUM (PORCINE) 1000 UNIT/ML IJ SOLN
INTRAMUSCULAR | Status: AC
  Filled 2019-06-29: qty 1

## 2019-06-29 MED ORDER — OXYCODONE HCL 5 MG PO TABS: 5.0000 mg | ORAL_TABLET | Freq: Once | ORAL | Status: DC | PRN

## 2019-06-29 MED ORDER — ONDANSETRON HCL 4 MG/2ML IJ SOLN
4.0000 mg | INTRAMUSCULAR | Status: DC | PRN
  Administered 2019-06-29: 4 mg via INTRAVENOUS

## 2019-06-29 MED ORDER — TRAMADOL HCL 50 MG PO TABS: 50.0000 mg | ORAL_TABLET | Freq: Four times a day (QID) | ORAL | 0 refills | Status: DC | PRN

## 2019-06-29 MED ORDER — PROMETHAZINE HCL 25 MG/ML IJ SOLN
INTRAMUSCULAR | Status: AC
  Filled 2019-06-29: qty 1

## 2019-06-29 MED ORDER — LIDOCAINE 2% (20 MG/ML) 5 ML SYRINGE
INTRAMUSCULAR | Status: AC
  Filled 2019-06-29: qty 5

## 2019-06-29 MED ORDER — OXYCODONE HCL 5 MG/5ML PO SOLN: 5.0000 mg | Freq: Once | ORAL | Status: DC | PRN

## 2019-06-29 MED ORDER — ONDANSETRON HCL 4 MG/2ML IJ SOLN
INTRAMUSCULAR | Status: AC
  Filled 2019-06-29: qty 2

## 2019-06-29 MED ORDER — SODIUM CHLORIDE 0.9 % IV BOLUS
1000.0000 mL | Freq: Once | INTRAVENOUS | Status: AC
  Administered 2019-06-29: 1000 mL via INTRAVENOUS

## 2019-06-29 MED ORDER — DIPHENHYDRAMINE HCL 12.5 MG/5ML PO ELIX
12.5000 mg | ORAL_SOLUTION | Freq: Four times a day (QID) | ORAL | Status: DC | PRN
  Filled 2019-06-29: qty 10

## 2019-06-29 MED ORDER — CHLORHEXIDINE GLUCONATE CLOTH 2 % EX PADS
6.0000 | MEDICATED_PAD | Freq: Every day | CUTANEOUS | Status: DC
  Administered 2019-06-30: 6 via TOPICAL

## 2019-06-29 MED ORDER — MIDAZOLAM HCL 2 MG/2ML IJ SOLN
INTRAMUSCULAR | Status: AC
  Filled 2019-06-29: qty 2

## 2019-06-29 MED ORDER — DEXAMETHASONE SODIUM PHOSPHATE 10 MG/ML IJ SOLN
INTRAMUSCULAR | Status: AC
  Filled 2019-06-29: qty 1

## 2019-06-29 MED ORDER — PROPOFOL 10 MG/ML IV BOLUS
INTRAVENOUS | Status: DC | PRN
  Administered 2019-06-29: 140 mg via INTRAVENOUS

## 2019-06-29 MED ORDER — STERILE WATER FOR IRRIGATION IR SOLN
Status: DC | PRN
  Administered 2019-06-29: 1000 mL

## 2019-06-29 MED ORDER — EPHEDRINE SULFATE-NACL 50-0.9 MG/10ML-% IV SOSY
PREFILLED_SYRINGE | INTRAVENOUS | Status: DC | PRN
  Administered 2019-06-29 (×2): 10 mg via INTRAVENOUS

## 2019-06-29 MED ORDER — PROMETHAZINE HCL 25 MG/ML IJ SOLN
6.2500 mg | INTRAMUSCULAR | Status: DC | PRN
  Administered 2019-06-29: 6.25 mg via INTRAVENOUS

## 2019-06-29 MED ORDER — LACTATED RINGERS IV SOLN
INTRAVENOUS | Status: DC | PRN
  Administered 2019-06-29: 1000 mL

## 2019-06-29 MED ORDER — DOCUSATE SODIUM 100 MG PO CAPS
100.0000 mg | ORAL_CAPSULE | Freq: Two times a day (BID) | ORAL | Status: DC
  Administered 2019-06-29 – 2019-06-30 (×2): 100 mg via ORAL
  Filled 2019-06-29 (×2): qty 1

## 2019-06-29 MED ORDER — MIDAZOLAM HCL 2 MG/2ML IJ SOLN
INTRAMUSCULAR | Status: DC | PRN
  Administered 2019-06-29: 2 mg via INTRAVENOUS

## 2019-06-29 MED ORDER — KETOROLAC TROMETHAMINE 30 MG/ML IJ SOLN
30.0000 mg | Freq: Once | INTRAMUSCULAR | Status: AC | PRN
  Administered 2019-06-29: 30 mg via INTRAVENOUS

## 2019-06-29 MED ORDER — EPHEDRINE 5 MG/ML INJ
INTRAVENOUS | Status: AC
  Filled 2019-06-29: qty 10

## 2019-06-29 MED ORDER — DEXAMETHASONE SODIUM PHOSPHATE 10 MG/ML IJ SOLN
INTRAMUSCULAR | Status: DC | PRN
  Administered 2019-06-29: 10 mg via INTRAVENOUS

## 2019-06-29 MED ORDER — ROCURONIUM BROMIDE 10 MG/ML (PF) SYRINGE
PREFILLED_SYRINGE | INTRAVENOUS | Status: AC
  Filled 2019-06-29: qty 10

## 2019-06-29 MED ORDER — SUGAMMADEX SODIUM 200 MG/2ML IV SOLN
INTRAVENOUS | Status: DC | PRN
  Administered 2019-06-29: 150 mg via INTRAVENOUS

## 2019-06-29 MED ORDER — SUFENTANIL CITRATE 50 MCG/ML IV SOLN
INTRAVENOUS | Status: AC
  Filled 2019-06-29: qty 1

## 2019-06-29 MED ORDER — PROPOFOL 10 MG/ML IV BOLUS
INTRAVENOUS | Status: AC
  Filled 2019-06-29: qty 20

## 2019-06-29 SURGICAL SUPPLY — 63 items
ADH SKN CLS APL DERMABOND .7 (GAUZE/BANDAGES/DRESSINGS) ×2
APL PRP STRL LF DISP 70% ISPRP (MISCELLANEOUS) ×2
APL SWBSTK 6 STRL LF DISP (MISCELLANEOUS) ×2
APPLICATOR COTTON TIP 6 STRL (MISCELLANEOUS) ×2 IMPLANT
APPLICATOR COTTON TIP 6IN STRL (MISCELLANEOUS) ×4
CATH FOLEY 2WAY SLVR 18FR 30CC (CATHETERS) ×4 IMPLANT
CATH ROBINSON RED A/P 16FR (CATHETERS) ×4 IMPLANT
CATH ROBINSON RED A/P 8FR (CATHETERS) ×4 IMPLANT
CATH TIEMANN FOLEY 18FR 5CC (CATHETERS) ×4 IMPLANT
CHLORAPREP W/TINT 26 (MISCELLANEOUS) ×4 IMPLANT
CLIP VESOLOCK LG 6/CT PURPLE (CLIP) ×8 IMPLANT
COVER SURGICAL LIGHT HANDLE (MISCELLANEOUS) ×4 IMPLANT
COVER TIP SHEARS 8 DVNC (MISCELLANEOUS) ×2 IMPLANT
COVER TIP SHEARS 8MM DA VINCI (MISCELLANEOUS) ×4
COVER WAND RF STERILE (DRAPES) IMPLANT
CUTTER ECHEON FLEX ENDO 45 340 (ENDOMECHANICALS) ×4 IMPLANT
DECANTER SPIKE VIAL GLASS SM (MISCELLANEOUS) ×4 IMPLANT
DERMABOND ADVANCED (GAUZE/BANDAGES/DRESSINGS) ×2
DERMABOND ADVANCED .7 DNX12 (GAUZE/BANDAGES/DRESSINGS) ×2 IMPLANT
DRAIN CHANNEL RND F F (WOUND CARE) IMPLANT
DRAPE ARM DVNC X/XI (DISPOSABLE) ×8 IMPLANT
DRAPE COLUMN DVNC XI (DISPOSABLE) ×2 IMPLANT
DRAPE DA VINCI XI ARM (DISPOSABLE) ×16
DRAPE DA VINCI XI COLUMN (DISPOSABLE) ×4
DRAPE SURG IRRIG POUCH 19X23 (DRAPES) ×4 IMPLANT
DRSG TEGADERM 4X4.75 (GAUZE/BANDAGES/DRESSINGS) ×4 IMPLANT
ELECT REM PT RETURN 15FT ADLT (MISCELLANEOUS) ×4 IMPLANT
GLOVE BIO SURGEON STRL SZ 6.5 (GLOVE) ×3 IMPLANT
GLOVE BIO SURGEONS STRL SZ 6.5 (GLOVE) ×1
GLOVE BIOGEL M STRL SZ7.5 (GLOVE) ×8 IMPLANT
GOWN STRL REUS W/TWL LRG LVL3 (GOWN DISPOSABLE) ×12 IMPLANT
HEMOSTAT SURGICEL 4X8 (HEMOSTASIS) ×2 IMPLANT
HOLDER FOLEY CATH W/STRAP (MISCELLANEOUS) ×4 IMPLANT
IRRIG SUCT STRYKERFLOW 2 WTIP (MISCELLANEOUS) ×4
IRRIGATION SUCT STRKRFLW 2 WTP (MISCELLANEOUS) ×2 IMPLANT
IV LACTATED RINGERS 1000ML (IV SOLUTION) ×4 IMPLANT
KIT TURNOVER KIT A (KITS) IMPLANT
NDL SAFETY ECLIPSE 18X1.5 (NEEDLE) ×2 IMPLANT
NEEDLE HYPO 18GX1.5 SHARP (NEEDLE) ×4
PACK ROBOT UROLOGY CUSTOM (CUSTOM PROCEDURE TRAY) ×4 IMPLANT
PENCIL SMOKE EVACUATOR (MISCELLANEOUS) IMPLANT
RELOAD STAPLE 45 4.1 GRN THCK (STAPLE) ×2 IMPLANT
SEAL CANN UNIV 5-8 DVNC XI (MISCELLANEOUS) ×8 IMPLANT
SEAL XI 5MM-8MM UNIVERSAL (MISCELLANEOUS) ×16
SET TUBE SMOKE EVAC HIGH FLOW (TUBING) ×4 IMPLANT
SOLUTION ELECTROLUBE (MISCELLANEOUS) ×4 IMPLANT
STAPLE RELOAD 45 GRN (STAPLE) ×2 IMPLANT
STAPLE RELOAD 45MM GREEN (STAPLE) ×4
SUT ETHILON 3 0 PS 1 (SUTURE) ×4 IMPLANT
SUT MNCRL 3 0 RB1 (SUTURE) ×2 IMPLANT
SUT MNCRL 3 0 VIOLET RB1 (SUTURE) ×2 IMPLANT
SUT MNCRL AB 4-0 PS2 18 (SUTURE) ×8 IMPLANT
SUT MONOCRYL 3 0 RB1 (SUTURE) ×8
SUT VIC AB 0 CT1 27 (SUTURE) ×4
SUT VIC AB 0 CT1 27XBRD ANTBC (SUTURE) ×2 IMPLANT
SUT VIC AB 0 UR5 27 (SUTURE) ×4 IMPLANT
SUT VIC AB 2-0 SH 27 (SUTURE) ×4
SUT VIC AB 2-0 SH 27X BRD (SUTURE) ×2 IMPLANT
SUT VICRYL 0 UR6 27IN ABS (SUTURE) ×8 IMPLANT
SYR 27GX1/2 1ML LL SAFETY (SYRINGE) ×4 IMPLANT
TOWEL OR NON WOVEN STRL DISP B (DISPOSABLE) ×4 IMPLANT
TROCAR XCEL NON-BLD 5MMX100MML (ENDOMECHANICALS) IMPLANT
WATER STERILE IRR 1000ML POUR (IV SOLUTION) ×4 IMPLANT

## 2019-06-29 NOTE — Discharge Instructions (Signed)

## 2019-06-29 NOTE — Transfer of Care (Signed)
Immediate Anesthesia Transfer of Care Note  Patient: CHISTIAN BURRUEL  Procedure(s) Performed: XI ROBOTIC ASSISTED LAPAROSCOPIC RADICAL PROSTATECTOMY LEVEL 2 (N/A ) LYMPHADENECTOMY, PELVIC (Bilateral )  Patient Location: PACU  Anesthesia Type:General  Level of Consciousness: drowsy  Airway & Oxygen Therapy: Patient Spontanous Breathing and Patient connected to face mask oxygen  Post-op Assessment: Report given to RN and Post -op Vital signs reviewed and stable  Post vital signs: Reviewed and stable  Last Vitals:  Vitals Value Taken Time  BP 129/74 06/29/19 1537  Temp    Pulse 92 06/29/19 1539  Resp 14 06/29/19 1539  SpO2 100 % 06/29/19 1539  Vitals shown include unvalidated device data.  Last Pain:  Vitals:   06/29/19 0956  TempSrc:   PainSc: 0-No pain         Complications: No apparent anesthesia complications

## 2019-06-29 NOTE — Op Note (Signed)
Preoperative diagnosis: Clinically localized adenocarcinoma of the prostate (clinical stage T2a Nx Mx)  Postoperative diagnosis: Clinically localized adenocarcinoma of the prostate (clinical stage T2a Nx Mx)  Procedure:  1. Robotic assisted laparoscopic radical prostatectomy (bilateral nerve sparing) 2. Bilateral robotic assisted laparoscopic pelvic lymphadenectomy  Surgeon: Pryor Curia. M.D.  Assistant: Debbrah Alar, PA-C  An assistant was required for this surgical procedure.  The duties of the assistant included but were not limited to suctioning, passing suture, camera manipulation, retraction. This procedure would not be able to be performed without an Environmental consultant.  Anesthesia: General  Complications: None  EBL: 125 mL  IVF:  2000 mL crystalloid  Specimens: 1. Prostate and seminal vesicles 2. Right pelvic lymph nodes 3. Left pelvic lymph nodes  Disposition of specimens: Pathology  Drains: 1. 20 Fr coude catheter 2. # 19 Blake pelvic drain  Indication: Brian Greene is a 69 y.o. year old patient with clinically localized prostate cancer.  After a thorough review of the management options for treatment of prostate cancer, he elected to proceed with surgical therapy and the above procedure(s).  We have discussed the potential benefits and risks of the procedure, side effects of the proposed treatment, the likelihood of the patient achieving the goals of the procedure, and any potential problems that might occur during the procedure or recuperation. Informed consent has been obtained.  Description of procedure:  The patient was taken to the operating room and a general anesthetic was administered. He was given preoperative antibiotics, placed in the dorsal lithotomy position, and prepped and draped in the usual sterile fashion. Next a preoperative timeout was performed. A urethral catheter was placed into the bladder and a site was selected near the umbilicus for  placement of the camera port. This was placed using a standard open Hassan technique which allowed entry into the peritoneal cavity under direct vision and without difficulty. An 8 mm robotic port was placed and a pneumoperitoneum established. The camera was then used to inspect the abdomen and there was no evidence of any intra-abdominal injuries or other abnormalities. The remaining abdominal ports were then placed. 8 mm robotic ports were placed in the right lower quadrant, left lower quadrant, and far left lateral abdominal wall. A 5 mm port was placed in the right upper quadrant and a 12 mm port was placed in the right lateral abdominal wall for laparoscopic assistance. All ports were placed under direct vision without difficulty. The surgical cart was then docked.   Utilizing the cautery scissors, the bladder was reflected posteriorly allowing entry into the space of Retzius and identification of the endopelvic fascia and prostate. The periprostatic fat was then removed from the prostate allowing full exposure of the endopelvic fascia. The endopelvic fascia was then incised from the apex back to the base of the prostate bilaterally and the underlying levator muscle fibers were swept laterally off the prostate thereby isolating the dorsal venous complex. The dorsal vein was then stapled and divided with a 45 mm Flex Echelon stapler. Attention then turned to the bladder neck which was divided anteriorly thereby allowing entry into the bladder and exposure of the urethral catheter. The catheter balloon was deflated and the catheter was brought into the operative field and used to retract the prostate anteriorly. The posterior bladder neck was then examined and was divided allowing further dissection between the bladder and prostate posteriorly until the vasa deferentia and seminal vessels were identified. The vasa deferentia were isolated, divided, and lifted anteriorly.  The seminal vesicles were dissected down  to their tips with care to control the seminal vascular arterial blood supply. These structures were then lifted anteriorly and the space between Denonvillier's fascia and the anterior rectum was developed with a combination of sharp and blunt dissection. This isolated the vascular pedicles of the prostate.  The lateral prostatic fascia was then sharply incised allowing release of the neurovascular bundles bilaterally. The vascular pedicles of the prostate were then ligated with Weck clips between the prostate and neurovascular bundles and divided with sharp cold scissor dissection resulting in neurovascular bundle preservation. The neurovascular bundles were then separated off the apex of the prostate and urethra bilaterally.  The urethra was then sharply transected allowing the prostate specimen to be disarticulated. The pelvis was copiously irrigated and hemostasis was ensured. There was no evidence for rectal injury.  Attention then turned to the right pelvic sidewall. The fibrofatty tissue between the external iliac vein, confluence of the iliac vessels, hypogastric artery, and Cooper's ligament was dissected free from the pelvic sidewall with care to preserve the obturator nerve. Weck clips were used for lymphostasis and hemostasis. An identical procedure was performed on the contralateral side and the lymphatic packets were removed for permanent pathologic analysis.  Attention then turned to the urethral anastomosis. A 2-0 Vicryl slip knot was placed between Denonvillier's fascia, the posterior bladder neck, and the posterior urethra to reapproximate these structures. A double-armed 3-0 Monocryl suture was then used to perform a 360 running tension-free anastomosis between the bladder neck and urethra. A new urethral catheter was then placed into the bladder and irrigated. There were no blood clots within the bladder and the anastomosis appeared to be watertight. A #19 Blake drain was then brought  through the left lateral 8 mm port site and positioned appropriately within the pelvis. It was secured to the skin with a nylon suture. The surgical cart was then undocked. The right lateral 12 mm port site was closed at the fascial level with a 0 Vicryl suture placed laparoscopically. All remaining ports were then removed under direct vision. The prostate specimen was removed intact within the Endopouch retrieval bag via the periumbilical camera port site. This fascial opening was closed with two running 0 Vicryl sutures. 0.25% Marcaine was then injected into all port sites and all incisions were reapproximated at the skin level with 4-0 Monocryl subcuticular sutures and Dermabond. The patient appeared to tolerate the procedure well and without complications. The patient was able to be extubated and transferred to the recovery unit in satisfactory condition.   Pryor Curia MD

## 2019-06-29 NOTE — Anesthesia Preprocedure Evaluation (Signed)
Anesthesia Evaluation  Patient identified by MRN, date of birth, ID band Patient awake    Reviewed: Allergy & Precautions, NPO status , Patient's Chart, lab work & pertinent test results  Airway Mallampati: II  TM Distance: >3 FB Neck ROM: Full    Dental no notable dental hx.    Pulmonary neg pulmonary ROS,    Pulmonary exam normal breath sounds clear to auscultation       Cardiovascular negative cardio ROS Normal cardiovascular exam Rhythm:Regular Rate:Normal     Neuro/Psych negative neurological ROS  negative psych ROS   GI/Hepatic negative GI ROS, Neg liver ROS,   Endo/Other  negative endocrine ROS  Renal/GU negative Renal ROS  negative genitourinary   Musculoskeletal negative musculoskeletal ROS (+)   Abdominal   Peds negative pediatric ROS (+)  Hematology negative hematology ROS (+)   Anesthesia Other Findings   Reproductive/Obstetrics negative OB ROS                             Anesthesia Physical Anesthesia Plan  ASA: II  Anesthesia Plan: General   Post-op Pain Management:    Induction: Intravenous  PONV Risk Score and Plan: 2 and Ondansetron, Dexamethasone and Treatment may vary due to age or medical condition  Airway Management Planned: Oral ETT  Additional Equipment:   Intra-op Plan:   Post-operative Plan: Extubation in OR  Informed Consent: I have reviewed the patients History and Physical, chart, labs and discussed the procedure including the risks, benefits and alternatives for the proposed anesthesia with the patient or authorized representative who has indicated his/her understanding and acceptance.   Dental advisory given  Plan Discussed with: CRNA and Surgeon  Anesthesia Plan Comments:         Anesthesia Quick Evaluation  

## 2019-06-29 NOTE — Anesthesia Procedure Notes (Signed)
Procedure Name: Intubation Date/Time: 06/29/2019 12:48 PM Performed by: Sharlette Dense, CRNA Patient Re-evaluated:Patient Re-evaluated prior to induction Oxygen Delivery Method: Circle system utilized Preoxygenation: Pre-oxygenation with 100% oxygen Induction Type: IV induction Ventilation: Mask ventilation without difficulty and Oral airway inserted - appropriate to patient size Laryngoscope Size: Miller and 3 Grade View: Grade I Tube type: Oral Tube size: 8.0 mm Number of attempts: 1 Airway Equipment and Method: Stylet Placement Confirmation: ETT inserted through vocal cords under direct vision,  breath sounds checked- equal and bilateral and positive ETCO2 Secured at: 22 cm Tube secured with: Tape Dental Injury: Teeth and Oropharynx as per pre-operative assessment

## 2019-06-29 NOTE — Progress Notes (Signed)
Patient ID: Brian Greene, male   DOB: February 01, 1951, 69 y.o.   MRN: [id number]  Post-op note  Subjective: The patient is doing well.  No complaints.  Objective: Vital signs in last 24 hours: Temp:  [97.4 F (36.3 C)-97.7 F (36.5 C)] 97.4 F (36.3 C) (03/11 1645) Pulse Rate:  [71-92] 80 (03/11 1645) Resp:  [9-16] 13 (03/11 1645) BP: (111-133)/(68-81) 121/79 (03/11 1645) SpO2:  [94 %-100 %] 98 % (03/11 1645)  Intake/Output from previous day: No intake/output data recorded. Intake/Output this shift: Total I/O In: 3100 [I.V.:2000; IV Piggyback:1100] Out: 125 [Blood:125]  Physical Exam:  General: Alert and oriented. Abdomen: Soft, Nondistended. Incisions: Clean and dry. Urine: Clearing.  Lab Results: Recent Labs    06/29/19 1547  HGB 14.6  HCT 45.0    Assessment/Plan: POD#0   1) Continue to monitor, ambulate, IS   Pryor Curia. MD   LOS: 0 days   Dutch Gray 06/29/2019, 5:38 PM

## 2019-06-29 NOTE — Anesthesia Postprocedure Evaluation (Signed)
Anesthesia Post Note  Patient: Brian Greene  Procedure(s) Performed: XI ROBOTIC ASSISTED LAPAROSCOPIC RADICAL PROSTATECTOMY LEVEL 2 (N/A ) LYMPHADENECTOMY, PELVIC (Bilateral )     Patient location during evaluation: PACU Anesthesia Type: General Level of consciousness: awake and alert Pain management: pain level controlled Vital Signs Assessment: post-procedure vital signs reviewed and stable Respiratory status: spontaneous breathing, nonlabored ventilation, respiratory function stable and patient connected to nasal cannula oxygen Cardiovascular status: blood pressure returned to baseline and stable Postop Assessment: no apparent nausea or vomiting Anesthetic complications: no    Last Vitals:  Vitals:   06/29/19 1630 06/29/19 1645  BP: 111/68 121/79  Pulse: 75 80  Resp: 12 13  Temp:  (!) 36.3 C  SpO2: 94% 98%    Last Pain:  Vitals:   06/29/19 1630  TempSrc:   PainSc: Asleep                 Eilam Shrewsbury S

## 2019-06-30 DIAGNOSIS — C61 Malignant neoplasm of prostate: Secondary | ICD-10-CM | POA: Diagnosis not present

## 2019-06-30 LAB — HEMOGLOBIN AND HEMATOCRIT, BLOOD
HCT: 39 % (ref 39.0–52.0)
HCT: 38.4 % — ABNORMAL LOW (ref 39.0–52.0)
Hemoglobin: 12.6 g/dL — ABNORMAL LOW (ref 13.0–17.0)
Hemoglobin: 12.5 g/dL — ABNORMAL LOW (ref 13.0–17.0)

## 2019-06-30 MED ORDER — BISACODYL 10 MG RE SUPP
10.0000 mg | Freq: Once | RECTAL | Status: AC
  Administered 2019-06-30: 10 mg via RECTAL
  Filled 2019-06-30: qty 1

## 2019-06-30 MED ORDER — TRAMADOL HCL 50 MG PO TABS
50.0000 mg | ORAL_TABLET | Freq: Four times a day (QID) | ORAL | Status: DC | PRN
  Administered 2019-06-30: 50 mg via ORAL
  Filled 2019-06-30: qty 1

## 2019-06-30 NOTE — Progress Notes (Signed)
JP drain removed without any trouble. Gauze dressing applied.

## 2019-06-30 NOTE — Plan of Care (Signed)
  Problem: Education: Goal: Required Educational Video(s) Outcome: Progressing   Problem: Clinical Measurements: Goal: Ability to maintain clinical measurements within normal limits will improve Outcome: Progressing Goal: Postoperative complications will be avoided or minimized Outcome: Progressing   

## 2019-06-30 NOTE — Progress Notes (Signed)
Patient ambulated in hallway with RN. Patient tolerated ambulation well. Will continue to monitor closely.

## 2019-06-30 NOTE — Discharge Summary (Signed)
Date of admission: 06/29/2019  Date of discharge: 06/30/2019  Admission diagnosis: Prostate Cancer  Discharge diagnosis: Prostate Cancer  History and Physical: For full details, please see admission history and physical. Briefly, Brian Greene is a 69 y.o. gentleman with localized prostate cancer.  After discussing management/treatment options, he elected to proceed with surgical treatment.  Hospital Course: Brian Greene was taken to the operating room on 06/29/2019 and underwent a robotic assisted laparoscopic radical prostatectomy. He tolerated this procedure well and without complications. Postoperatively, he was able to be transferred to a regular hospital room following recovery from anesthesia.  He was able to begin ambulating the night of surgery. He remained hemodynamically stable overnight.  He had excellent urine output with appropriately minimal output from his pelvic drain and his pelvic drain was removed on POD #1.  He was transitioned to oral pain medication, tolerated a clear liquid diet, and had met all discharge criteria and was able to be discharged home later on POD#1.  Laboratory values:  Recent Labs    06/29/19 1547 06/30/19 0316 06/30/19 1048  HGB 14.6 12.6* 12.5*  HCT 45.0 39.0 38.4*    Disposition: Home  Discharge instruction: He was instructed to be ambulatory but to refrain from heavy lifting, strenuous activity, or driving. He was instructed on urethral catheter care.  Discharge medications:   Allergies as of 06/30/2019      Reactions   Penicillins Rash   Did it involve swelling of the face/tongue/throat, SOB, or low BP? No Did it involve sudden or severe rash/hives, skin peeling, or any reaction on the inside of your mouth or nose? No Did you need to seek medical attention at a hospital or doctor's office? No When did it last happen? If all above answers are "NO", may proceed with cephalosporin use.      Medication List    TAKE these medications    mupirocin ointment 2 % Commonly known as: BACTROBAN Apply 1 application topically 2 (two) times daily.   sulfamethoxazole-trimethoprim 800-160 MG tablet Commonly known as: BACTRIM DS Take 1 tablet by mouth 2 (two) times daily. Start the day prior to foley removal appointment   traMADol 50 MG tablet Commonly known as: Ultram Take 1-2 tablets (50-100 mg total) by mouth every 6 (six) hours as needed for moderate pain or severe pain.       Followup: He will followup in 1 week for catheter removal and to discuss his surgical pathology results.

## 2019-07-06 LAB — SURGICAL PATHOLOGY

## 2020-06-11 DIAGNOSIS — L578 Other skin changes due to chronic exposure to nonionizing radiation: Secondary | ICD-10-CM | POA: Diagnosis not present

## 2020-06-11 DIAGNOSIS — L853 Xerosis cutis: Secondary | ICD-10-CM | POA: Diagnosis not present

## 2020-06-11 DIAGNOSIS — L821 Other seborrheic keratosis: Secondary | ICD-10-CM | POA: Diagnosis not present

## 2020-11-18 DIAGNOSIS — D1801 Hemangioma of skin and subcutaneous tissue: Secondary | ICD-10-CM | POA: Diagnosis not present

## 2020-11-18 DIAGNOSIS — Z85828 Personal history of other malignant neoplasm of skin: Secondary | ICD-10-CM | POA: Diagnosis not present

## 2020-11-18 DIAGNOSIS — L905 Scar conditions and fibrosis of skin: Secondary | ICD-10-CM | POA: Diagnosis not present

## 2020-11-18 DIAGNOSIS — L814 Other melanin hyperpigmentation: Secondary | ICD-10-CM | POA: Diagnosis not present

## 2020-11-18 DIAGNOSIS — L821 Other seborrheic keratosis: Secondary | ICD-10-CM | POA: Diagnosis not present

## 2021-04-24 DIAGNOSIS — E782 Mixed hyperlipidemia: Secondary | ICD-10-CM | POA: Diagnosis not present

## 2021-04-24 DIAGNOSIS — R059 Cough, unspecified: Secondary | ICD-10-CM | POA: Diagnosis not present

## 2021-04-24 DIAGNOSIS — Z0000 Encounter for general adult medical examination without abnormal findings: Secondary | ICD-10-CM | POA: Diagnosis not present

## 2021-04-24 DIAGNOSIS — Z23 Encounter for immunization: Secondary | ICD-10-CM | POA: Diagnosis not present

## 2021-04-24 DIAGNOSIS — Z79899 Other long term (current) drug therapy: Secondary | ICD-10-CM | POA: Diagnosis not present

## 2021-04-24 DIAGNOSIS — I700 Atherosclerosis of aorta: Secondary | ICD-10-CM | POA: Diagnosis not present

## 2021-04-24 DIAGNOSIS — Z131 Encounter for screening for diabetes mellitus: Secondary | ICD-10-CM | POA: Diagnosis not present

## 2021-07-23 DIAGNOSIS — I700 Atherosclerosis of aorta: Secondary | ICD-10-CM | POA: Diagnosis not present

## 2021-07-23 DIAGNOSIS — R053 Chronic cough: Secondary | ICD-10-CM | POA: Diagnosis not present

## 2021-08-05 ENCOUNTER — Ambulatory Visit
Admission: RE | Admit: 2021-08-05 | Discharge: 2021-08-05 | Disposition: A | Discharge status: Home / Self Care | Payer: Medicare Other | Source: Ambulatory Visit | Attending: Family Medicine | Admitting: Family Medicine

## 2021-08-05 ENCOUNTER — Other Ambulatory Visit: Payer: Self-pay | Admitting: Family Medicine

## 2021-08-05 DIAGNOSIS — R053 Chronic cough: Secondary | ICD-10-CM

## 2021-11-18 DIAGNOSIS — L814 Other melanin hyperpigmentation: Secondary | ICD-10-CM | POA: Diagnosis not present

## 2021-11-18 DIAGNOSIS — D1801 Hemangioma of skin and subcutaneous tissue: Secondary | ICD-10-CM | POA: Diagnosis not present

## 2021-11-18 DIAGNOSIS — Z85828 Personal history of other malignant neoplasm of skin: Secondary | ICD-10-CM | POA: Diagnosis not present

## 2021-11-18 DIAGNOSIS — L821 Other seborrheic keratosis: Secondary | ICD-10-CM | POA: Diagnosis not present

## 2021-11-18 DIAGNOSIS — Z08 Encounter for follow-up examination after completed treatment for malignant neoplasm: Secondary | ICD-10-CM | POA: Diagnosis not present

## 2021-12-23 ENCOUNTER — Ambulatory Visit (INDEPENDENT_AMBULATORY_CARE_PROVIDER_SITE_OTHER): Payer: Medicare Other

## 2021-12-23 ENCOUNTER — Ambulatory Visit: Payer: Medicare Other | Admitting: Orthopaedic Surgery

## 2021-12-23 ENCOUNTER — Encounter: Payer: Self-pay | Admitting: Orthopaedic Surgery

## 2021-12-23 VITALS — Ht 67.0 in | Wt 139.2 lb

## 2021-12-23 DIAGNOSIS — M1712 Unilateral primary osteoarthritis, left knee: Secondary | ICD-10-CM | POA: Diagnosis not present

## 2021-12-23 DIAGNOSIS — M25562 Pain in left knee: Secondary | ICD-10-CM | POA: Diagnosis not present

## 2021-12-23 DIAGNOSIS — G8929 Other chronic pain: Secondary | ICD-10-CM

## 2021-12-23 MED ORDER — METHYLPREDNISOLONE ACETATE 40 MG/ML IJ SUSP
80.0000 mg | INTRAMUSCULAR | Status: AC | PRN
  Administered 2021-12-23: 80 mg via INTRA_ARTICULAR

## 2021-12-23 MED ORDER — LIDOCAINE HCL 1 % IJ SOLN
2.0000 mL | INTRAMUSCULAR | Status: AC | PRN
  Administered 2021-12-23: 2 mL

## 2021-12-23 MED ORDER — BUPIVACAINE HCL 0.25 % IJ SOLN
2.0000 mL | INTRAMUSCULAR | Status: AC | PRN
  Administered 2021-12-23: 2 mL via INTRA_ARTICULAR

## 2021-12-23 NOTE — Progress Notes (Signed)
Office Visit Note   Patient: Brian Greene           Date of Birth: [birth year]           MRN: [id number] Visit Date: 12/23/2021              Requested by: Orpah Melter, MD [address] [place],  [place] [address] PCP: Orpah Melter, MD   Assessment & Plan: Visit Diagnoses:  1. Chronic pain of left knee   2. Unilateral primary osteoarthritis, left knee     Plan: Brian Greene was seen in 2018 for left knee pain and had an MRI scan revealing tearing of the medial meniscus associated with tricompartmental chondromalacia predominantly in the medial compartment where there is some areas of full-thickness cartilage loss.  He actually has done well over the years only having some pain recently while working on his car with repetitive bending stooping and squatting.  Pain is localized along the medial compartment.  X-rays demonstrate little bit of varus in the knee but the joint spaces are relatively well-maintained.  Based on his prior MRI scan and present symptoms I think the majority of his pain is related to the arthritis.  From a diagnostic and therapeutic standpoint I am going to inject the medial compartment of his left knee with Depo-Medrol, Marcaine and Xylocaine and monitor response.  Follow-Up Instructions: Return if symptoms worsen or fail to improve.   Orders:  Orders Placed This Encounter  Procedures   XR KNEE 3 VIEW LEFT   No orders of the defined types were placed in this encounter.     Procedures: Large Joint Inj: L knee on 12/23/2021 4:06 PM Indications: pain and diagnostic evaluation Details: 25 G 1.5 in needle, anteromedial approach  Arthrogram: No  Medications: 2 mL lidocaine 1 %; 80 mg methylPREDNISolone acetate 40 MG/ML; 2 mL bupivacaine 0.25 % Procedure, treatment alternatives, risks and benefits explained, specific risks discussed. Consent was given by the patient. Patient was prepped and draped in the usual sterile fashion.       Clinical  Data: No additional findings.   Subjective: Chief Complaint  Patient presents with   Left Knee - New Patient (Initial Visit)   Patient presets today as a new patient for his left knee pain. Patient states that he has had previous left knee MRI which showed a meniscus tear. Patient did not have surgery to correct this. He has noticed that pain has gradually been getting worse within the past 6 months. The knee pain that he has been feeling has been increased his left hip pains.  Review of Systems   Objective: Vital Signs: Ht '5\' 7"'$  (1.702 m)   Wt 139 lb 3.2 oz (63.1 kg)   BMI 21.80 kg/m   Physical Exam Constitutional:      Appearance: He is well-developed.  Eyes:     Pupils: Pupils are equal, round, and reactive to light.  Pulmonary:     Effort: Pulmonary effort is normal.  Skin:    General: Skin is warm and dry.  Neurological:     Mental Status: He is alert and oriented to person, place, and time.  Psychiatric:        Behavior: Behavior normal.     Ortho Exam left knee was not hot red warm or swollen.  No effusion.  Mostly medial joint pain.  Minimal varus with weightbearing.  Skin was intact.  No edema.  Full extension of flex to over 105  degrees without instability.  No popliteal pain or mass.  No calf pain.  No distal edema.  Walks without a limp.  Painless range of motion both hips  Specialty Comments:  No specialty comments available.  Imaging: XR KNEE 3 VIEW LEFT  Result Date: 12/23/2021 Films of the left knee obtained in 3 projections standing.  There is about 1 degree of varus and very minimal narrowing of the medial joint space.  Some mild subchondral sclerosis on both sides of the joint the medial and lateral.  Very minimal osteophyte formation.  Films are consistent with mild to moderate osteoarthritis without acute change or ectopic calcification    PMFS History: Patient Active Problem List   Diagnosis Date Noted   Unilateral primary osteoarthritis, left  knee 12/23/2021   Prostate cancer ([place]) 06/29/2019   Ingrown toenail 04/10/2018   History reviewed. No pertinent past medical history.  Family History  Problem Relation Age of Onset   Diabetes Mother     Past Surgical History:  Procedure Laterality Date   LYMPHADENECTOMY Bilateral 06/29/2019   Procedure: LYMPHADENECTOMY, PELVIC;  Surgeon: Raynelle Bring, MD;  Location: [facility];  Service: Urology;  Laterality: Bilateral;   ROBOT ASSISTED LAPAROSCOPIC RADICAL PROSTATECTOMY N/A 06/29/2019   Procedure: XI ROBOTIC ASSISTED LAPAROSCOPIC RADICAL PROSTATECTOMY LEVEL 2;  Surgeon: Raynelle Bring, MD;  Location: [facility];  Service: Urology;  Laterality: N/A;   TONSILLECTOMY     Social History   Occupational History   Not on file  Tobacco Use   Smoking status: Never   Smokeless tobacco: Never  Vaping Use   Vaping Use: Never used  Substance and Sexual Activity   Alcohol use: Yes    Comment: occasionally   Drug use: No   Sexual activity: Not on file

## 2022-01-14 ENCOUNTER — Ambulatory Visit: Payer: Medicare Other | Admitting: Orthopaedic Surgery

## 2022-01-20 ENCOUNTER — Ambulatory Visit: Payer: Medicare Other | Admitting: Orthopaedic Surgery

## 2022-10-29 DIAGNOSIS — E7800 Pure hypercholesterolemia, unspecified: Secondary | ICD-10-CM | POA: Diagnosis not present

## 2022-10-29 DIAGNOSIS — Z131 Encounter for screening for diabetes mellitus: Secondary | ICD-10-CM | POA: Diagnosis not present

## 2022-10-29 DIAGNOSIS — Z0000 Encounter for general adult medical examination without abnormal findings: Secondary | ICD-10-CM | POA: Diagnosis not present

## 2022-10-29 DIAGNOSIS — I700 Atherosclerosis of aorta: Secondary | ICD-10-CM | POA: Diagnosis not present

## 2022-10-29 DIAGNOSIS — R413 Other amnesia: Secondary | ICD-10-CM | POA: Diagnosis not present

## 2022-10-29 DIAGNOSIS — R5383 Other fatigue: Secondary | ICD-10-CM | POA: Diagnosis not present

## 2022-10-29 DIAGNOSIS — R093 Abnormal sputum: Secondary | ICD-10-CM | POA: Diagnosis not present

## 2022-11-19 DIAGNOSIS — D692 Other nonthrombocytopenic purpura: Secondary | ICD-10-CM | POA: Diagnosis not present

## 2022-11-19 DIAGNOSIS — L570 Actinic keratosis: Secondary | ICD-10-CM | POA: Diagnosis not present

## 2022-11-19 DIAGNOSIS — L821 Other seborrheic keratosis: Secondary | ICD-10-CM | POA: Diagnosis not present

## 2022-11-19 DIAGNOSIS — Z08 Encounter for follow-up examination after completed treatment for malignant neoplasm: Secondary | ICD-10-CM | POA: Diagnosis not present

## 2022-11-19 DIAGNOSIS — D1801 Hemangioma of skin and subcutaneous tissue: Secondary | ICD-10-CM | POA: Diagnosis not present

## 2022-11-19 DIAGNOSIS — L814 Other melanin hyperpigmentation: Secondary | ICD-10-CM | POA: Diagnosis not present

## 2022-11-19 DIAGNOSIS — Z85828 Personal history of other malignant neoplasm of skin: Secondary | ICD-10-CM | POA: Diagnosis not present

## 2023-03-09 DIAGNOSIS — R059 Cough, unspecified: Secondary | ICD-10-CM | POA: Diagnosis not present

## 2023-03-09 DIAGNOSIS — R0602 Shortness of breath: Secondary | ICD-10-CM | POA: Diagnosis not present

## 2023-03-09 DIAGNOSIS — B9689 Other specified bacterial agents as the cause of diseases classified elsewhere: Secondary | ICD-10-CM | POA: Diagnosis not present

## 2023-03-09 DIAGNOSIS — J208 Acute bronchitis due to other specified organisms: Secondary | ICD-10-CM | POA: Diagnosis not present

## 2023-04-13 DIAGNOSIS — U071 COVID-19: Secondary | ICD-10-CM | POA: Diagnosis not present

## 2023-04-19 IMAGING — DX DG CHEST 2V
2 series · 2 of 2 positions shown · non-contrast
Comparison: Chest radiograph dated 05/25/2014.

CLINICAL DATA: Chronic cough.

EXAM:
CHEST - 2 VIEW

[dg chest 2 view (1 of 2)]
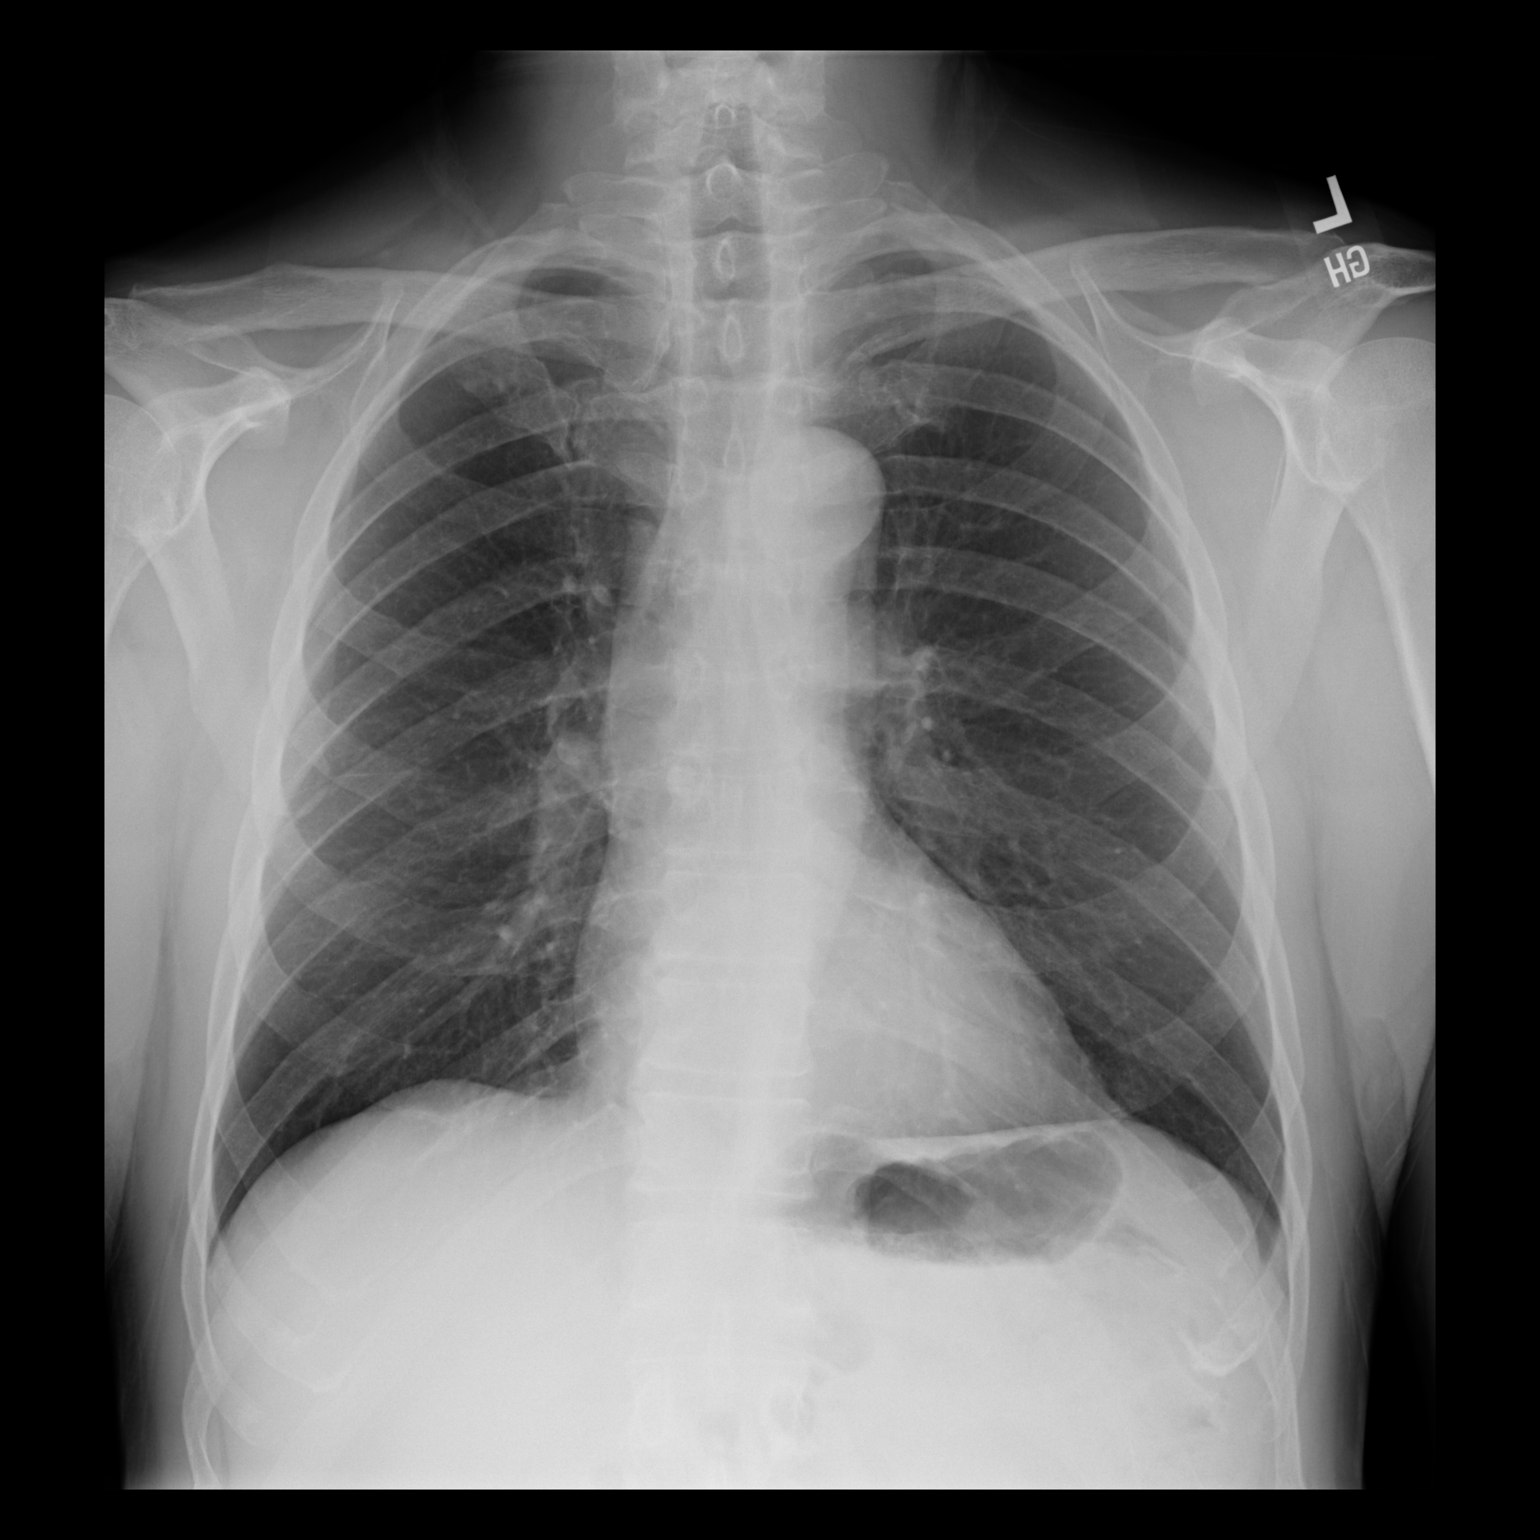

[dg chest 2 view (2 of 2)]
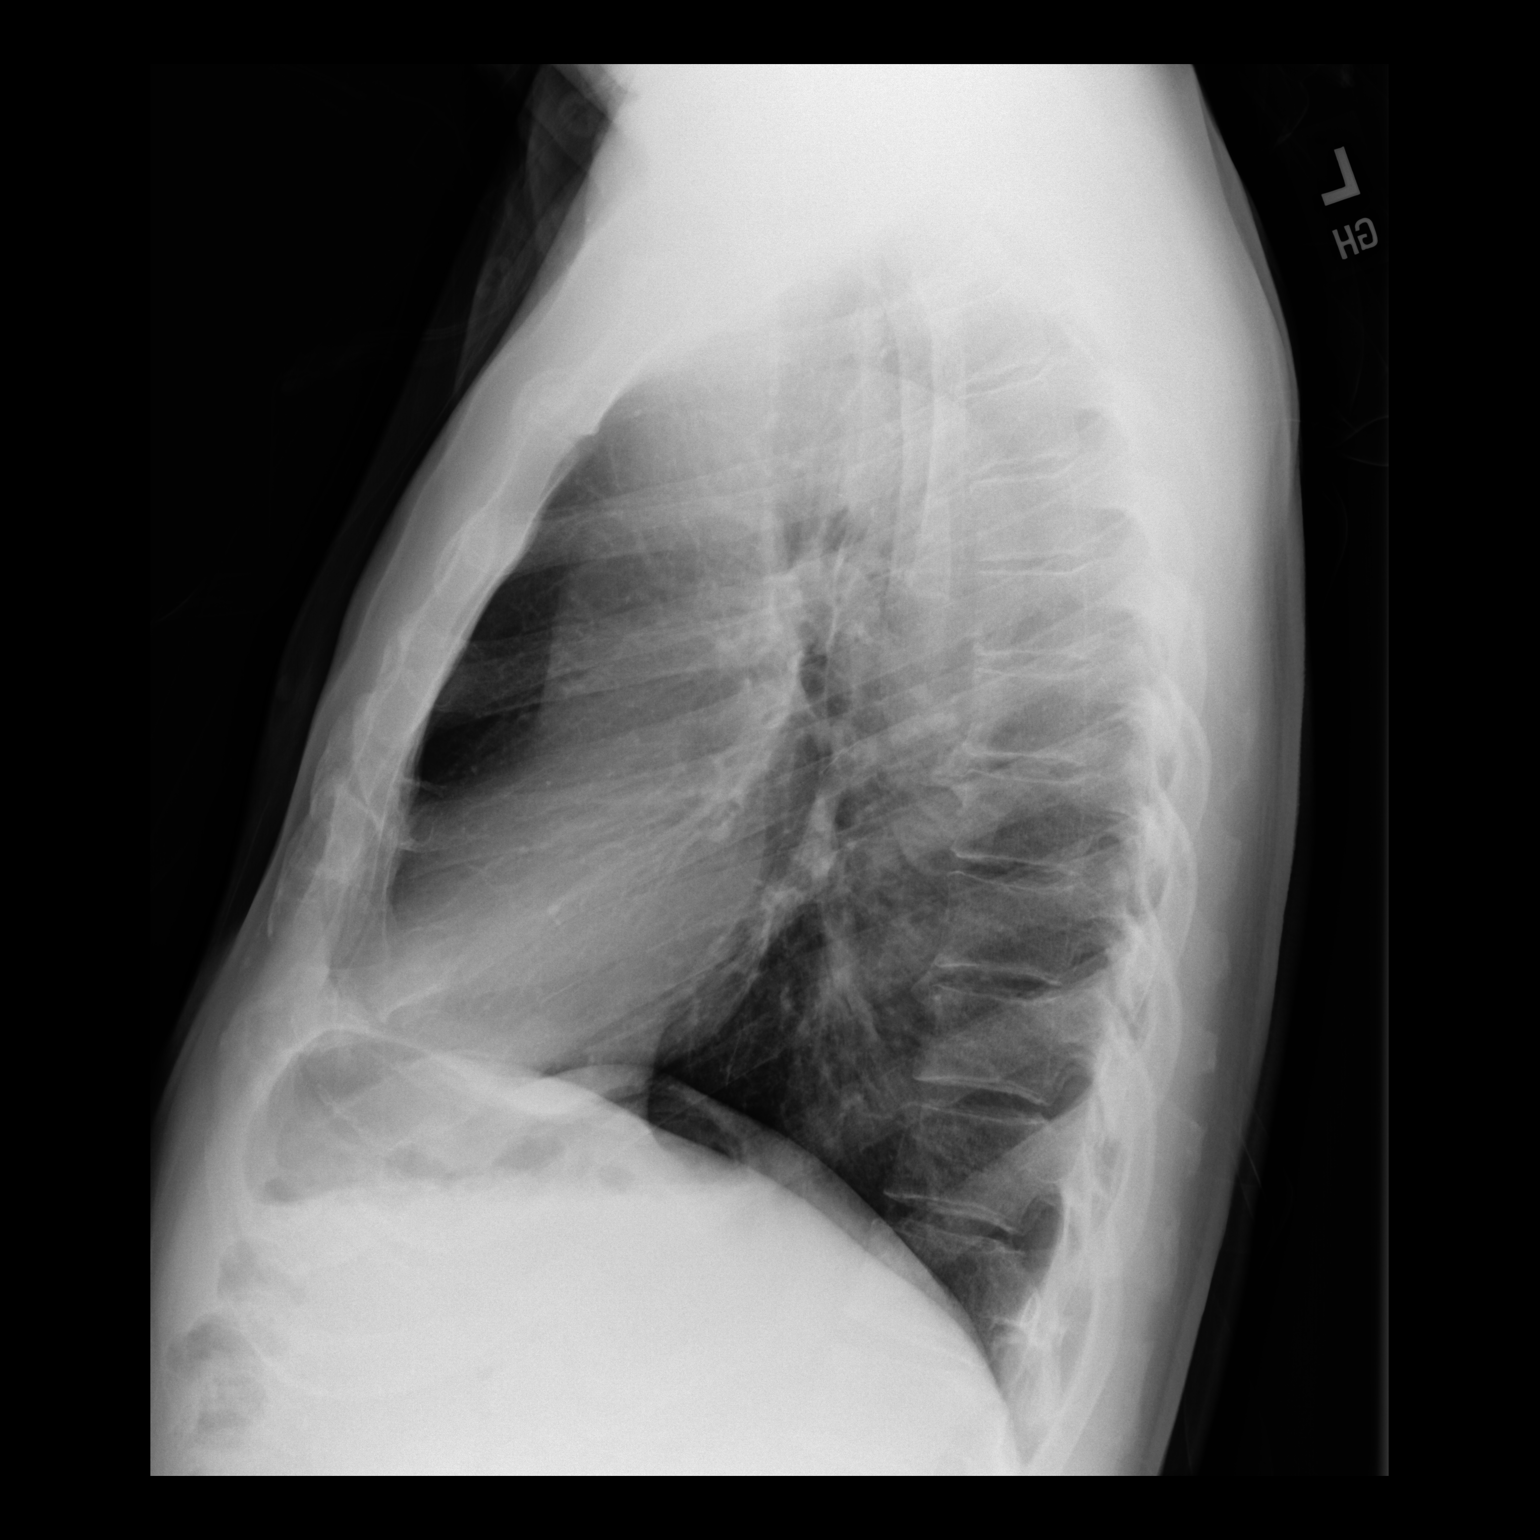

[2 of 2 positions shown; findings below may reference images not displayed]

FINDINGS: The heart size and mediastinal contours are within normal limits.
Both lungs are clear. The visualized skeletal structures are
unremarkable.
IMPRESSION: No active cardiopulmonary disease.

## 2023-11-22 DIAGNOSIS — Z13220 Encounter for screening for lipoid disorders: Secondary | ICD-10-CM | POA: Diagnosis not present

## 2023-11-22 DIAGNOSIS — Z136 Encounter for screening for cardiovascular disorders: Secondary | ICD-10-CM | POA: Diagnosis not present

## 2023-11-22 DIAGNOSIS — Z0000 Encounter for general adult medical examination without abnormal findings: Secondary | ICD-10-CM | POA: Diagnosis not present

## 2023-11-22 DIAGNOSIS — Z131 Encounter for screening for diabetes mellitus: Secondary | ICD-10-CM | POA: Diagnosis not present

## 2023-12-02 DIAGNOSIS — L821 Other seborrheic keratosis: Secondary | ICD-10-CM | POA: Diagnosis not present

## 2023-12-02 DIAGNOSIS — D1801 Hemangioma of skin and subcutaneous tissue: Secondary | ICD-10-CM | POA: Diagnosis not present

## 2023-12-02 DIAGNOSIS — L570 Actinic keratosis: Secondary | ICD-10-CM | POA: Diagnosis not present

## 2023-12-02 DIAGNOSIS — Z08 Encounter for follow-up examination after completed treatment for malignant neoplasm: Secondary | ICD-10-CM | POA: Diagnosis not present

## 2023-12-02 DIAGNOSIS — Z85828 Personal history of other malignant neoplasm of skin: Secondary | ICD-10-CM | POA: Diagnosis not present

## 2023-12-02 DIAGNOSIS — L814 Other melanin hyperpigmentation: Secondary | ICD-10-CM | POA: Diagnosis not present

## 2024-02-14 ENCOUNTER — Other Ambulatory Visit (HOSPITAL_COMMUNITY): Payer: Self-pay | Admitting: Urology

## 2024-02-14 DIAGNOSIS — C61 Malignant neoplasm of prostate: Secondary | ICD-10-CM

## 2024-02-24 ENCOUNTER — Encounter (HOSPITAL_COMMUNITY)
Admission: RE | Admit: 2024-02-24 | Discharge: 2024-02-24 | Disposition: A | Discharge status: Home / Self Care | Source: Ambulatory Visit | Attending: Urology | Admitting: Urology

## 2024-02-24 DIAGNOSIS — C61 Malignant neoplasm of prostate: Secondary | ICD-10-CM | POA: Diagnosis present

## 2024-02-24 MED ORDER — FLOTUFOLASTAT F 18 GALLIUM 296-5846 MBQ/ML IV SOLN
7.4000 | Freq: Once | INTRAVENOUS | Status: AC
  Administered 2024-02-24: 7.4 via INTRAVENOUS

## 2024-03-13 ENCOUNTER — Telehealth: Payer: Self-pay | Admitting: Radiation Oncology

## 2024-03-13 NOTE — Telephone Encounter (Signed)
 Left message for patient to call back to schedule consult per 11/24 referral.

## 2024-03-14 ENCOUNTER — Telehealth: Payer: Self-pay | Admitting: Radiation Oncology

## 2024-03-14 NOTE — Telephone Encounter (Signed)
 Left message for patient to call back to schedule consult per 11/24 referral.

## 2024-03-15 ENCOUNTER — Telehealth: Payer: Self-pay | Admitting: Radiation Oncology

## 2024-03-15 NOTE — Telephone Encounter (Signed)
 Left message for patient to call back to schedule consult per 11/24 referral.

## 2024-03-20 NOTE — Progress Notes (Signed)
 GU Location of Tumor / Histology: Prostate Ca (biochemical recurrent)  Radical Prostatectomy (06/29/2019)  PSA is (0.3 on 03/03/2024)  Brian Greene presented as a referral from Dr. Gretel Ferrara [facility]) elevated PSA.  Biopsies     02/24/2024 Dr. Gretel Ferrara NM PET (PSMA) Skull to Mid Thigh CLINICAL DATA:  Prostate cancer   IMPRESSION: 1. No evidence of local prostate cancer recurrence in the prostatectomy bed. 2. No evidence of metastatic adenopathy in the pelvis or periaortic retroperitoneum. 3. No evidence of visceral or skeletal metastasis.  Past/Anticipated interventions by urology, if any:  Dr. Gretel Ferrara   Past/Anticipated interventions by medical oncology, if any:  NA  Weight changes, if any: No  IPSS:  5 SHIM: 1  not sexually active  Bowel/Bladder complaints, if any:  No  Nausea/Vomiting, if any: No  Pain issues, if any:  0/10  SAFETY ISSUES: Prior radiation? No Pacemaker/ICD? No Possible current pregnancy? Male Is the patient on methotrexate? No  Current Complaints / other details:  None  30 minutes spent total, including time for meaningful use questions, reviewing medication, as well as spent in face-to-face time in nurse evaluation with the patient.

## 2024-03-26 NOTE — Progress Notes (Signed)
 Radiation Oncology         [phone] ________________________________  Initial Outpatient Consultation  Name: Brian Greene MRN: [id number]  Date: 03/27/2024  DOB: 1950/08/17  RR:Fzbzmd, Garnette, MD  Renda Glance, MD   REFERRING PHYSICIAN: Renda Glance, MD  DIAGNOSIS: 73 y.o. gentleman with biochemically recurrent prostate cancer with postoperative PSA of 0.3 s/p RALP 06/2019 for Stage pT2N0, Gleason 3+4 prostate cancer    ICD-10-CM   1. Biochemically recurrent malignant neoplasm of prostate after prostatectomy (HCC)  C61    R97.21    Z90.79     2. Biochemically recurrent prostate cancer after prostatectomy (HCC)  C61    R97.21    Z90.79     3. Prostate cancer [facility])  C61       HISTORY OF PRESENT ILLNESS: Brian Greene is a 73 y.o. male with a diagnosis of biochemically recurrent prostate cancer. He was initially referred to Dr. Watt for an elevated PSA of 5.06 in 03/2019 and was subsequently diagnosed with Gleason 3+4 prostate cancer involving 6 of 12 cores on TRUSP biopsy performed 04/20/19. He met with Dr. Renda in consult and ultimately elected to proceed with RALP which was performed on 06/29/19. Final surgical pathology revealed Gleason 3+4 prostatic adenocarcinoma with with no evidence of extraprostatic extension and negative margins and lymph nodes. His postoperative PSA was undetectable.  He was lost to follow up with urology, and the patient continued evaluation with his PCP. His PSA became detectable in 10/2022 at 0.09. From that time, it quickly increased to 0.25 in 11/2023. He was subsequently referred back for evaluation with Dr. Renda on 12/07/23. A repeat PSA obtained at that time confirmed the rise at 0.2. This prompted a restaging PSMA PET scan on 02/24/24 showing no evidence of active disease, local or metastatic. A repeat PSA on 03/03/24 was further elevated at 0.3, meeting criteria for biochemical recurrence.  The patient reviewed the pathology and PSA  results with his urologist and he has kindly been referred today for discussion of potential salvage radiation treatment options.   PREVIOUS RADIATION THERAPY: No  PAST MEDICAL HISTORY:  Past Medical History:  Diagnosis Date   Dementia (HCC)    Elevated PSA    Hypercholesterolemia    Prostate cancer [facility])       PAST SURGICAL HISTORY: Past Surgical History:  Procedure Laterality Date   LYMPHADENECTOMY Bilateral 06/29/2019   Procedure: LYMPHADENECTOMY, PELVIC;  Surgeon: Renda Glance, MD;  Location: [facility];  Service: Urology;  Laterality: Bilateral;   PROSTATE BIOPSY     ROBOT ASSISTED LAPAROSCOPIC RADICAL PROSTATECTOMY N/A 06/29/2019   Procedure: XI ROBOTIC ASSISTED LAPAROSCOPIC RADICAL PROSTATECTOMY LEVEL 2;  Surgeon: Renda Glance, MD;  Location: [facility];  Service: Urology;  Laterality: N/A;   TONSILLECTOMY      FAMILY HISTORY:  Family History  Problem Relation Age of Onset   Diabetes Mother     SOCIAL HISTORY:  Social History   Socioeconomic History   Marital status: Married    Spouse name: Not on file   Number of children: Not on file   Years of education: Not on file   Highest education level: Not on file  Occupational History   Not on file  Tobacco Use   Smoking status: Never   Smokeless tobacco: Never  Vaping Use   Vaping status: Never Used  Substance and Sexual Activity   Alcohol use: Yes    Comment: occasionally   Drug use: No   Sexual activity: Not Currently  Other Topics Concern   Not on file  Social History Narrative   Not on file   Social Drivers of Health   Financial Resource Strain: Not on file  Food Insecurity: No Food Insecurity (03/27/2024)   Hunger Vital Sign    Worried About Running Out of Food in the Last Year: Never true    Ran Out of Food in the Last Year: Never true  Transportation Needs: No Transportation Needs (03/27/2024)   PRAPARE - Administrator, Civil Service (Medical): No    Lack of Transportation  (Non-Medical): No  Physical Activity: Not on file  Stress: Not on file  Social Connections: Not on file  Intimate Partner Violence: Not At Risk (03/27/2024)   Humiliation, Afraid, Rape, and Kick questionnaire    Fear of Current or Ex-Partner: No    Emotionally Abused: No    Physically Abused: No    Sexually Abused: No    ALLERGIES: Penicillins and Atorvastatin calcium  MEDICATIONS:  No current outpatient medications on file.   No current facility-administered medications for this encounter.    REVIEW OF SYSTEMS:  On review of systems, the patient reports that he is doing well overall. He denies any chest pain, shortness of breath, cough, fevers, chills, night sweats, unintended weight changes. He denies any bowel disturbances, and denies abdominal pain, nausea or vomiting. He denies any new musculoskeletal or joint aches or pains. His IPSS was 5, indicating mild urinary symptoms. His SHIM was 1, indicating he has severe postoperative erectile dysfunction. A complete review of systems is obtained and is otherwise negative.    PHYSICAL EXAM:  Wt Readings from Last 3 Encounters:  03/27/24 131 lb (59.4 kg)  12/23/21 139 lb 3.2 oz (63.1 kg)  06/29/19 149 lb 7.6 oz (67.8 kg)   Temp Readings from Last 3 Encounters:  03/27/24 97.9 F (36.6 C)  06/30/19 98.4 F (36.9 C) (Oral)  06/26/19 97.7 F (36.5 C) (Oral)   BP Readings from Last 3 Encounters:  03/27/24 123/70  06/30/19 102/65  06/26/19 107/70   Pulse Readings from Last 3 Encounters:  03/27/24 73  06/30/19 69  06/26/19 65   Pain Assessment Pain Score: 0-No pain/10  In general this is a well appearing Caucasian man in no acute distress. He's alert and oriented x4 and appropriate throughout the examination but does show signs of early alzheimer's dementia, requiring information be repeated multiple times. Otherwise, he is in excellent health. Cardiopulmonary assessment is negative for acute distress, and he exhibits normal  effort.     KPS = 90  100 - Normal; no complaints; no evidence of disease. 90   - Able to carry on normal activity; minor signs or symptoms of disease. 80   - Normal activity with effort; some signs or symptoms of disease. 20   - Cares for self; unable to carry on normal activity or to do active work. 60   - Requires occasional assistance, but is able to care for most of his personal needs. 50   - Requires considerable assistance and frequent medical care. 40   - Disabled; requires special care and assistance. 30   - Severely disabled; hospital admission is indicated although death not imminent. 20   - Very sick; hospital admission necessary; active supportive treatment necessary. 10   - Moribund; fatal processes progressing rapidly. 0     - Dead  Karnofsky DA, Abelmann WH, Craver LS and Burchenal [facility] [phone]) The use of the nitrogen mustards in  the palliative treatment of carcinoma: with particular reference to bronchogenic carcinoma Cancer 1 634-56  LABORATORY DATA:  Lab Results  Component Value Date   WBC 6.3 06/26/2019   HGB 12.5 (L) 06/30/2019   HCT 38.4 (L) 06/30/2019   MCV 91.1 06/26/2019   PLT 233 06/26/2019   Lab Results  Component Value Date   NA 142 06/26/2019   K 4.1 06/26/2019   CL 107 06/26/2019   CO2 29 06/26/2019   No results found for: ALT, AST, GGT, ALKPHOS, BILITOT   RADIOGRAPHY: No results found.    IMPRESSION/PLAN: 1. 73 y.o. gentleman with biochemically recurrent prostate cancer with postoperative PSA of 0.3 s/p RALP 06/2019 for Stage pT2N0, Gleason 3+4 prostate cancer  Today, we reviewed the findings and workup thus far.  We discussed the natural history of prostate cancer.  We reviewed the the implications of positive margins, extracapsular extension, and seminal vesicle involvement on the risk of prostate cancer recurrence. In his case, there were no high-risk features on his final pathology but he has a detectable, rising postoperative PSA,  indicating biochemical recurrence. We reviewed some of the evidence suggesting an advantage for patients who undergo adjuvant radiotherapy in this setting in terms of disease control and overall survival. We discussed radiation treatment directed to the prostatic fossa and pelvic lymph nodes with regard to the logistics and delivery of external beam radiation treatment.  At the conclusion of our conversation, the patient is interested in moving forward with the recommended 7.5 week course of  daily salvage external beam therapy but prefers to start the daily treatments in Jan. 2026. We will share our discussion with Dr. Renda. The patient appears to have a good understanding of his disease and our treatment recommendations which are of curative intent and is in agreement with the stated plan.  He has freely signed written consent to proceed today in the office and a copy of this document will be placed in his medical record.  Therefore, we will move forward with scheduling CT simulation/treatment planning accordingly, in anticipation of beginning IMRT in the near future.  We enjoyed meeting him and his wife today and look forward to continuing to participate in his care.  We personally spent 60 minutes in this encounter including chart review, reviewing radiological studies, meeting face-to-face with the patient, entering orders and completing documentation.    Sabra MICAEL Rusk, PA-C    Donnice Barge, MD  [facility]  Radiation Oncology Direct Dial: [phone]  Fax: [phone] [email or web address]  Skype  [email or web address]   This document serves as a record of services personally performed by Donnice Barge, MD and Sabra Rusk, PA-C. It was created on their behalf by Izetta Neither, a trained medical scribe. The creation of this record is based on the scribe's personal observations and the provider's statements to them. This document has been checked and approved by the attending provider.

## 2024-03-27 ENCOUNTER — Ambulatory Visit
Admission: RE | Admit: 2024-03-27 | Discharge: 2024-03-27 | Disposition: A | Source: Ambulatory Visit | Attending: Radiation Oncology

## 2024-03-27 ENCOUNTER — Encounter: Payer: Self-pay | Admitting: Radiation Oncology

## 2024-03-27 ENCOUNTER — Ambulatory Visit
Admission: RE | Admit: 2024-03-27 | Discharge: 2024-03-27 | Disposition: A | Source: Ambulatory Visit | Attending: Radiation Oncology | Admitting: Radiation Oncology

## 2024-03-27 ENCOUNTER — Telehealth: Payer: Self-pay | Admitting: Radiation Oncology

## 2024-03-27 VITALS — BP 123/70 | HR 73 | Temp 97.9°F | Resp 18 | Ht 67.4 in | Wt 131.0 lb

## 2024-03-27 DIAGNOSIS — Z9079 Acquired absence of other genital organ(s): Secondary | ICD-10-CM

## 2024-03-27 DIAGNOSIS — C61 Malignant neoplasm of prostate: Secondary | ICD-10-CM

## 2024-03-27 HISTORY — DX: Malignant neoplasm of prostate: C61

## 2024-03-27 HISTORY — DX: Elevated prostate specific antigen (PSA): R97.20

## 2024-03-27 HISTORY — DX: Pure hypercholesterolemia, unspecified: E78.00

## 2024-03-27 HISTORY — DX: Unspecified dementia, unspecified severity, without behavioral disturbance, psychotic disturbance, mood disturbance, and anxiety: F03.90

## 2024-03-27 NOTE — Telephone Encounter (Signed)
 12/8 Patient's wife called to sch patient for his treatment planning appt.  Forward email to CT sim and copied Support RTT/Ashlyn, so they are aware.

## 2024-04-03 NOTE — Progress Notes (Signed)
 Introduced myself to the patient as the prostate nurse navigator. He is here to discuss his radiation treatment options. I provided patient with some written education and my direct contact information.  Patient knows to reach out with any questions or barriers that may arise.

## 2024-04-21 ENCOUNTER — Encounter: Payer: Self-pay | Admitting: Radiation Oncology

## 2024-04-24 ENCOUNTER — Ambulatory Visit: Admission: RE | Admit: 2024-04-24 | Source: Ambulatory Visit | Admitting: Radiation Oncology

## 2024-04-24 NOTE — Progress Notes (Signed)
 RN left message with patient's wife for call back.  Patient is wanting to cancel all upcoming radiation treatments due to having concerns.  RN will continue to reach out to assess treatment concerns.

## 2024-05-01 ENCOUNTER — Encounter: Payer: Self-pay | Admitting: Neurology

## 2024-05-01 ENCOUNTER — Other Ambulatory Visit: Payer: Self-pay

## 2024-05-01 ENCOUNTER — Ambulatory Visit: Admitting: Neurology

## 2024-05-01 ENCOUNTER — Telehealth: Payer: Self-pay | Admitting: Neurology

## 2024-05-01 VITALS — BP 126/84 | HR 78 | Ht 65.0 in | Wt 134.0 lb

## 2024-05-01 DIAGNOSIS — R419 Unspecified symptoms and signs involving cognitive functions and awareness: Secondary | ICD-10-CM | POA: Diagnosis not present

## 2024-05-01 DIAGNOSIS — G309 Alzheimer's disease, unspecified: Secondary | ICD-10-CM | POA: Diagnosis not present

## 2024-05-01 DIAGNOSIS — F22 Delusional disorders: Secondary | ICD-10-CM | POA: Diagnosis not present

## 2024-05-01 MED ORDER — DONEPEZIL HCL 5 MG PO TABS: 5.0000 mg | ORAL_TABLET | Freq: Every morning | ORAL | 0 refills | Status: AC

## 2024-05-01 MED ORDER — DONEPEZIL HCL 5 MG PO TABS: 5.0000 mg | ORAL_TABLET | Freq: Every morning | ORAL | 0 refills | Status: DC

## 2024-05-01 NOTE — Patient Instructions (Addendum)
 " Dear Brian Senior, Md [address] [place],  [place] [address],    Thank you for entrusting me with your patient's care.    ASSESSMENT  :   As you know, your patient  Brian Greene , a 74 y.o. male  retired engineer, building services , who presented here on 05-01-2024  for an Evaluation of memory difficulties upon your request. His wife accompanied him.  He had a slow onset of cognitive symptoms at age 42-69 and over the last 8 months shown a much faster progression with paranoia.   He was assessed with the following  : 1)  Advanced memory loss,  moderate dementia stage, 2)  confabulation and no insight in his condition, nor capacity of judgement.    He has increased reflexes, he has rigor on physical examination, he appears restless and defensive. He repeats questions over and over, ( 'so this is all just normal for my age , right ? ) and when these are answered to the contrary he still insists that all is normal.   No loss of smell but reduced taste - he uses now extra salt, pepper and condiments on his food.  He drinks high amounts of sweet tea and this is store bought, not home made.  He reportedly has manifested confabulations and hallucinations - imposter / other women/ someone stealing or intruding.   His MMSE was 15 (!!) / 30 points and he is confabulating during his interview and anamnesis.  Poor  judgement, limited insight.  High danger. Should not drive !    My Plan is to proceed with:    1) Lab : [facility] DEMENTIA PANEL  2) Lab : [facility] for biomarkers of AD  3) ordered lab : Late onset ALZHEIMER Genetic risk  4) ordered EEG for encephalopathy work up  5) MRI brain with and without , if not already done, ruling out vascular dementia.  6 ) Started Donepezil  (unless patient has bradycardia , IBS or REM BD) at 5 mg for 90 days, then increase to 10 mg- if tolerated. 7) arranged for Referral to neuropsychology for cognitive testing. 8) Refer for vision and audiology testing if  applicable.   The plan is to identify cognitive domains affected by changes, to rule out brain lesions or vascular abnormalities, to obtain testing of  AD bio-markers, genetic markers and , if applicable , consolidate these findings by a PET scan.  Serial testing  by [facility] or MMSE test is needed for all patients that are currently scoring in the subjective memory loss, MCI, or mild dementia category.  A referral for neuropsychological interview and testing battery  has been ordered.  Medication to help slow cognitive decline are available but may not be tolerated, these are available in oral and patch form and their use is not specific to a single form of neurodegenerative cognitive disorder. ( AD, Lewy body, fronto temporal dementia, Picks disease )   Aricept  was started at 5 mg daily.     I plan to follow up  through our NP or personally within 6 month. Caveat : Only early stages and mild stages of AD may qualify for infusion therapy, and  based on MOCA/MMSE score  Brian Greene does not represent this group.  He is confabulating and presented with visions of 'male guests in the home , he also believed that someone other than his wife was sleeping in their bedroom, an imposter. He may need antipsychotic medication.  As the origin of this patient's condition is still not defined , he may require frequent monitoring and adjustments in the treatment plan, reflecting the ongoing complexity of care.  This provider is for the named interval time the continuing focal point for associated medical /neurological needs services for this condition. [facility]ic providers will return patients to primary care after diagnostic work up if they do not qualify for neurologic -only provided advanced therapies.            Dementia Caregiver Guide Dementia is a condition that affects the way the brain works. It often affects thinking and memory. A person with dementia may: Forget things. Have trouble  talking or responding to your questions. Have trouble paying attention. Have trouble thinking clearly and making good decisions. Get lost or wander away from home or other places. Have big changes in their mood or emotions. They may: Feel very worried, nervous, or depressed. Have angry outbursts. Be suspicious or accuse you of things. Have childlike behavior and language. Taking care of someone with dementia can be a challenge. The tips below can help you care for the person. How to help manage lifestyle changes Dementia usually gets worse slowly over time. In the early stages, people with dementia can stay safe and take care of themselves with some help. In later stages, they need help with daily tasks like getting dressed, grooming, and going to the bathroom. Communicating When the person is talking and seems frustrated, make eye contact and hold the person's hand. Ask questions that can be answered with a yes or no. Use simple words and a calm voice. Only give one direction at a time. Limit choices for the person. Too many choices can be stressful. Avoid correcting the person in a negative way. If the person can't find the right words, gently try to help. Preventing injury  Keep floors clear. Remove rugs, magazine racks, and floor lamps. Keep hallways well lit, especially at night. Put a handrail and nonslip mat in the bathtub or shower. Put childproof locks on cabinets that have dangerous items in them. These items include medicine, alcohol, guns, cleaning products, and sharp tools. For doors to the outside, put locks where the person can't see or reach them. This helps keep the person from going out of the house and getting lost. Be ready for emergencies. Keep a list of emergency phone numbers and addresses close by. Remove car keys and lock garage doors so the person doesn't try to drive. Have the person wear a bracelet that tracks where they are and shows that they're a person with  memory loss. This should be worn at all times for safety. Helping with daily life  Keep the person on track with their daily routine. Try to identify areas where the person may need help. Be supportive, patient, calm, and encouraging. Gently remind the person that adjusting to changes takes time. Help with the tasks that the person has asked for help with. Keep the person involved in daily tasks and decisions as much as you can. Encourage conversation, but try not to get frustrated if the person struggles to find words or doesn't seem to appreciate your help. Other tips Think about any safety risks and take steps to avoid them. Keep things organized: Organize medicines in a pill box for each day of the week. Keep a calendar in a central place. Use it to remind the person of health care visits or other activities. Create a plan to handle any  legal or financial matters. Get help from a professional if needed. Help make sure the person: Takes medicines only as told by their health care providers. Eats regular, healthy meals. They should also drink plenty of fluids. Goes to all scheduled health care appointments. Gets regular sleep. Taking care of yourself Being a caregiver for someone with dementia can be hard. You may feel stressed and have many other emotions. It's important to also take care of yourself. Here are some tips: Find out about services that can provide short-term care for the person. This is called respite care. It can allow you to take a break when you need one. Find healthy ways to deal with stress. Some ways include: Spending time with other people. Exercising. Meditating or doing deep breathing exercises. Take care of your own health by: Getting enough sleep. Eating healthy foods. Getting regular exercise. Join a support group with others who are caregivers. These groups can help you: Learn other ways to deal with stress. Share experiences with others. Get emotional  comfort and support. Learn about caregiving as the disease gets worse. Find resources in your community. Where to find support: Many people and organizations offer support. These include: Support groups for people with dementia. Support groups for caregivers. Counselors or therapists. Home health care services. Adult day care centers. Where to find more information Alzheimer's Association: westerntunes.it Family Caregiver Alliance: caregiver.org Alzheimer's Foundation of America: alzfdn.org Contact a health care provider if: The person's health is quickly getting worse. You're no longer able to care for the person. Caring for the person is affecting your physical and emotional health. You're feeling worried, nervous, or depressed about caring for the person. Get help right away if: You feel like the person may hurt themselves or others. The person has talked about taking their own life. These symptoms may be an emergency. Take one of these steps right away: Go to your nearest emergency room. Call 911. Call the National Suicide Prevention Lifeline at 470-308-1258 or 988. Text the Crisis Text Line at 4845514339. This information is not intended to replace advice given to you by your health care provider. Make sure you discuss any questions you have with your health care provider. Document Revised: 07/17/2022 Document Reviewed: 07/17/2022 Elsevier Patient Education  2024 Elsevier Inc.  There are well-accepted and sensible ways to reduce risk for Alzheimers disease and other degenerative brain disorders .  Exercise Daily Walk A daily 20 minute walk should be part of your routine. Disease related apathy can be a significant roadblock to exercise and the only way to overcome this is to make it a daily routine and perhaps have a reward at the end (something your loved one loves to eat or drink perhaps) or a personal trainer coming to the home can also be very useful. Most importantly, the patient is  much more likely to exercise if the caregiver / spouse does it with him/her. In general a structured, repetitive schedule is best.  General Health: Any diseases which effect your body will effect your brain such as a pneumonia, urinary infection, blood clot, heart attack or stroke. Keep contact with your primary care doctor for regular follow ups.  Sleep. A good nights sleep is healthy for the brain. Seven hours is recommended. If you have insomnia or poor sleep habits we can give you some instructions. If you have sleep apnea wear your mask.  Diet: Eating a heart healthy diet is also a good idea; fish and poultry instead of red meat,  nuts (mostly non-peanuts), vegetables, fruits, olive oil or canola oil (instead of butter), minimal salt (use other spices to flavor foods), whole grain rice, bread, cereal and pasta and wine in moderation.Research is now showing that the MIND diet, which is a combination of The Mediterranean diet and the DASH diet, is beneficial for cognitive processing and longevity. Information about this diet can be found in The MIND Diet, a book by Annitta Feeling, MS, RDN, and online at wildwildscience.es  Finances, Power of [address] and Advance Directives: You should consider putting legal safeguards in place with regard to financial and medical decision making. While the spouse always has power of attorney for medical and financial issues in the absence of any form, you should consider what you want in case the spouse / caregiver is no longer around or capable of making decisions.   The Alzheimers Association Position on Disease Prevention  Can Alzheimer's be prevented? It's a question that continues to intrigue researchers and fuel new investigations. There are no clear-cut answers yet -- partially due to the need for more large-scale studies in diverse populations -- but promising research is under way. The Alzheimer's Association is leading the worldwide  effort to find a treatment for Alzheimer's, delay its onset and prevent it from developing.   What causes Alzheimer's? Experts agree that in the vast majority of cases, Alzheimer's, like other common chronic conditions, probably develops as a result of complex interactions among multiple factors, including age, genetics, environment, lifestyle and coexisting medical conditions. Although some risk factors -- such as age or genes -- cannot be changed, other risk factors -- such as high blood pressure and lack of exercise -- usually can be changed to help reduce risk. Research in these areas may lead to new ways to detect those at highest risk.  Prevention studies A small percentage of people with Alzheimer's disease (less than 1 percent) have an early-onset type associated with genetic mutations. Individuals who have these genetic mutations are guaranteed to develop the disease. An ongoing clinical trial conducted by the Dominantly Inherited Alzheimer Network (DIAN), is testing whether antibodies to beta-amyloid can reduce the accumulation of beta-amyloid plaque in the brains of people with such genetic mutations and thereby reduce, delay or prevent symptoms. Participants in the trial are receiving antibodies (or placebo) before they develop symptoms, and the development of beta-amyloid plaques is being monitored by brain scans and other tests.  Another clinical trial, known as the A4 trial (Anti-Amyloid Treatment in Asymptomatic Alzheimer's), is testing whether antibodies to beta-amyloid can reduce the risk of Alzheimer's disease in older people (ages 73 to 76) at high risk for the disease. The A4 trial is being conducted by the Alzheimer's Disease Cooperative Study.  Though research is still evolving, evidence is strong that people can reduce their risk by making key lifestyle changes, including participating in regular activity and maintaining good heart health. Based on this research, the Alzheimer's  Association offers 10 Ways to Love Your Brain -- a collection of tips that can reduce the risk of cognitive decline.  Heart-head connection  New research shows there are things we can do to reduce the risk of mild cognitive impairment and dementia.  Several conditions known to increase the risk of cardiovascular disease -- such as high blood pressure, diabetes and high cholesterol -- also increase the risk of developing Alzheimer's. Some autopsy studies show that as many as 80 percent of individuals with Alzheimer's disease also have cardiovascular disease.  A longstanding question is  why some people develop hallmark Alzheimer's plaques and tangles but do not develop the symptoms of Alzheimer's. Vascular disease may help researchers eventually find an answer. Some autopsy studies suggest that plaques and tangles may be present in the brain without causing symptoms of cognitive decline unless the brain also shows evidence of vascular disease. More research is needed to better understand the link between vascular health and Alzheimer's.  Physical exercise and diet Regular physical exercise may be a beneficial strategy to lower the risk of Alzheimer's and vascular dementia. Exercise may directly benefit brain cells by increasing blood and oxygen flow in the brain. Because of its known cardiovascular benefits, a medically approved exercise program is a valuable part of any overall wellness plan.  Current evidence suggests that heart-healthy eating may also help protect the brain. Heart-healthy eating includes limiting the intake of sugar and saturated fats and making sure to eat plenty of fruits, vegetables, and whole grains. No one diet is best. Two diets that have been studied and may be beneficial are the DASH (Dietary Approaches to Stop Hypertension) diet and the Mediterranean diet. The DASH diet emphasizes vegetables, fruits and fat-free or low-fat dairy products; includes whole grains, fish, poultry,  beans, seeds, nuts and vegetable oils; and limits sodium, sweets, sugary beverages and red meats. A Mediterranean diet includes relatively little red meat and emphasizes whole grains, fruits and vegetables, fish and shellfish, and nuts, olive oil and other healthy fats.  Social connections and intellectual activity A number of studies indicate that maintaining strong social connections and keeping mentally active as we age might lower the risk of cognitive decline and Alzheimer's. Experts are not certain about the reason for this association. It may be due to direct mechanisms through which social and mental stimulation strengthen connections between nerve cells in the brain.  Head trauma There appears to be a strong link between future risk of Alzheimer's and serious head trauma, especially when injury involves loss of consciousness. You can help reduce your risk of Alzheimer's by protecting your head.  Wear a seat belt  Use a helmet when participating in sports  Fall-proof your home   What you can do now While research is not yet conclusive, certain lifestyle choices, such as physical activity and diet, may help support brain health and prevent Alzheimer's. Many of these lifestyle changes have been shown to lower the risk of other diseases, like heart disease and diabetes, which have been linked to Alzheimer's. With few drawbacks and plenty of known benefits, healthy lifestyle choices can improve your health and possibly protect your brain.  Learn more about brain health. You can help increase our knowledge by considering participation in a clinical study. Our free clinical trial matching services, TrialMatch, can help you find clinical trials in your area that are seeking volunteers.  Understanding prevention research Here are some things to keep in mind about the research underlying much of our current knowledge about possible prevention:  Insights about potentially modifiable risk factors  apply to large population groups, not to individuals. Studies can show that factor X is associated with outcome Y, but cannot guarantee that any specific person will have that outcome. As a result, you can do everything right and still have a serious health problem or do everything wrong and live to be 100.  Much of our current evidence comes from large epidemiological studies such as the [place] Aging Study, the Nurses' Health Study, the Adult Changes in Thought Study and the [employer]. These  studies explore pre-existing behaviors and use statistical methods to relate those behaviors to health outcomes. This type of study can show an association between a factor and an outcome but cannot prove cause and effect. This is why we describe evidence based on these studies with such language as suggests, may show, might protect, and is associated with.  The gold standard for showing cause and effect is a clinical trial in which participants are randomly assigned to a prevention or risk management strategy or a control group. Researchers follow the two groups over time to see if their outcomes differ significantly.  It is unlikely that some prevention or risk management strategies will ever be tested in randomized trials for ethical or practical reasons. One example is exercise. Definitively testing the impact of exercise on Alzheimer's risk would require a huge trial enrolling thousands of people and following them for many years. The expense and logistics of such a trial would be prohibitive, and it would require some people to go without exercise, a known health benefit.      Dementia: Alzheimer's  versus  Lewy Body Dementia Lewy body dementia, or LBD, is a condition that affects the way your brain works. This type of dementia happens when proteins called Lewy bodies build up in the brain. It causes problems with memory, thinking, movement, and behavior. This condition gets  worse over time. There's no cure, but some treatments can help with the symptoms. What are the causes? LBD happens when Lewy bodies build up in parts of the brain that control memory, thinking, and movement. What causes these proteins to build up isn't known. What increases the risk? Having a family history of LBD or Parkinson's disease. Being 64 years old or older. Being male. What are the signs or symptoms? Symptoms may include: Hallucinating. This means you see, hear, taste, smell, or feel things that are not real. Sleep problems, such as acting out dreams while you're asleep. Changes in memory, attention, and ability to focus that come and go. Symptoms of dementia, such as having trouble with: Memory. Paying attention. Planning and organizing. Making good choices. Speaking. Behavior. People with LBD may also have symptoms of Parkinson's disease. These may include: Shaking movements or tremors that you can't control. This usually starts in a hand or foot when you're resting. Stooped posture. Slow movement. Stiff muscles. Loss of balance when standing. How is this diagnosed? LBD is diagnosed by an expert called a neurologist. It may be diagnosed based on: Your symptoms and medical history. Your health care provider will talk with you and your family, friends, or caregivers about your history and symptoms. A physical exam. Tests. These may include: Lab tests, such as blood or pee (urine) tests. Imaging tests. You may have a CT scan, a PET scan, or an MRI. A lumbar puncture. For this test, a sample of the fluid around the brain and spinal cord is taken and tested. A skin biopsy. This is when a skin sample is taken. The sample is looked at under a microscope to check for the protein. Tests to check your thinking and memory. How is this treated? There's no cure for LBD. Treatment helps manage your symptoms and might include: Medicines to help with hallucinations, sleep, and  behavior problems. Therapy to help with talking or movement. This might be speech, occupational, and physical therapy. Your provider can help you find support groups and other people who can help with your care. Follow these instructions at home: Medicines Take over-the-counter  and prescription medicines only as told by your provider. Use a pill organizer or pill reminder to help keep track of your medicines. Do not take medicines that can affect your thinking. This includes pain medicines and some medicines to help with sleep. Always check with your provider before taking any new medicines. Lifestyle Make healthy choices: Exercise and be active as told by your provider. Do not use any products that contain nicotine or tobacco. These products include cigarettes, chewing tobacco, and vaping devices, such as e-cigarettes. If you need help quitting, ask your provider. When you feel a lot of stress, do something that helps you relax. Try things like mindfulness, yoga, or deep breathing. Spend time with other people. Talk with family, friends, and neighbors often. Make sure you sleep well. These tips can help: Try not to take naps during the day. Keep your bedroom dark and cool. Do not exercise in the few hours before you go to bed. Do not have foods or drinks with caffeine in them at night. Eating and drinking Do not drink alcohol. Drink enough fluid to keep your pee (urine) pale yellow. Eat a healthy diet. Safety  Talk with your provider to decide: What things you need help with. How to stay safe. If you have trouble walking, use a cane or walker as told by your provider. Make sure your home is safe. Get rid of things that you could trip over, such as throw rugs or clutter. Put grab bars and railings in your home to keep you from falling. Talk with your provider about whether it's safe for you to drive. If told, wear an alert bracelet that tracks where you are and shows that you're a  person with memory loss. Make sure you wear it at all times. General instructions  Work with your family to make big legal or health decisions. This may include things like advance directives, medical power of attorney, or a living will. Join a support group for people with LBD. Where to find more information [employer] of Neurological Disorders and Stroke: basicfm.no Lewy Body Dementia Association: lbda.org Contact a health care provider if: You have confusion that's new or getting worse. You have trouble sleeping that's new or getting worse. You get sleepy during the day more often. You have problems with choking or swallowing. Get help right away if: You feel depressed or very sad or feel like you may hurt yourself. Your family members are worried about your safety. Get help right away if you feel like you may hurt yourself or others, or have thoughts about taking your own life. Go to your nearest emergency room or: Call 911. Call the National Suicide Prevention Lifeline at 907-209-7578 or 988. This is open 24 hours a day. Text the Crisis Text Line at 218-296-1470. This information is not intended to replace advice given to you by your health care provider. Make sure you discuss any questions you have with your health care provider. Document Revised: 06/22/2022 Document Reviewed: 06/22/2022 Elsevier Patient Education  2024 [employer] Dementia is a condition that affects the way the brain works. It often affects thinking and memory.  There are many types of dementia, including: Alzheimer's disease. This is the most common type. Vascular dementia. This type may happen due to a stroke. Lewy body dementia. This type may happen to people who have Parkinson's disease. Frontotemporal dementia. This type is caused by damage to nerve cells in certain parts of the brain. Some people may have more than  one type. What are the causes? Dementia is caused by damage to cells in the  brain. Some causes that can't be reversed include: Having a condition that affects the blood vessels of the brain. This may be diabetes or heart disease. Changes to genes. Some causes that can be reversed or slowed down include: Injury to the brain due to: A growth called a tumor. A blood clot. Too much fluid in the brain. Taking certain medicines. An infection. Problems with your thyroid . Not having enough vitamin B12 in the body. Having a disease that causes your body's defense system, called the immune system, to attack healthy parts of your body. What are the signs or symptoms? Symptoms of dementia start slowly and get worse with time. They may include: Problems remembering events or people. Getting lost easily. Forgetting appointments or to pay bills. Having trouble taking a bath or putting clothes on. Having trouble planning and making meals. Having trouble speaking. Changes in behavior or mood. How is this diagnosed? Dementia may be diagnosed based on: Your symptoms and medical history. A physical exam. Tests. These may include: Tests to check your thinking and memory to see how your brain is working. Lab tests. You may have tests on your blood or pee (urine). Imaging tests, such as a CT scan, a PET scan, or an MRI. Genetic testing. This may be done if other family members have had dementia. Your health care provider will talk with you and your family, friends, or caregivers about your history and symptoms. How is this treated? Treatment depends on the cause of the dementia and should start as soon as possible. It might include: Taking medicines for symptoms. Taking medicines to help control or slow down the dementia. Treating the cause of your dementia. Your provider can help you find support groups and other members of the health care team who can help with your care. Follow these instructions at home: Medicines Take medicines only as told by your provider. Use a  pill organizer or pill reminder to help you keep track of your medicines. Avoid taking medicines for pain or for sleep. These can affect your thinking. Lifestyle Make healthy choices. Be active as told by your provider. Do not smoke, vape, or use products with nicotine or tobacco in them. If you need help quitting, talk with your provider. Do not drink alcohol. When you feel a lot of stress, do something that helps you relax. Your provider can give you tips. Spend time with other people. Make sure you get good sleep at night. These tips can help: Try not to take naps during the day. Keep your bedroom dark and cool. Do not exercise in the few hours before you go to bed. Do not have foods or drinks with caffeine at night. Eating and drinking Drink enough fluid to keep your pee pale yellow. Eat a healthy diet. General instructions  Talk with your provider to decide on: What things you need help with. What your safety needs are. Ask your provider if it's safe for you to drive. If told, wear a bracelet that tracks where you are or shows that you're a person with memory loss. Work with your family to make big legal or health decisions. This may include things like advance directives, medical power of attorney, or a living will. Where to find more information Alzheimer[employer]: westerntunes.it [employer] on Aging: baseringtones.pl World Health Organization: visitdestination.com.br Contact a health care provider if: You have any new symptoms. Your symptoms get  worse. You have problems with swallowing. Get help right away if: You feel very sad or feel like you may hurt yourself or others. You have thoughts about taking your own life. Your family members are worried about your safety. These symptoms may be an emergency. Take one of these steps right away: Go to your nearest emergency room. Call 911. Call the National Suicide Prevention Lifeline at (917)706-0698 or 988. Text the Crisis Text Line at  5103803218. This information is not intended to replace advice given to you by your health care provider. Make sure you discuss any questions you have with your health care provider. Document Revised: 02/04/2023 Document Reviewed: 06/22/2022 Elsevier Patient Education  2024 [employer]. "

## 2024-05-01 NOTE — Telephone Encounter (Signed)
 no auth required sent to [facility] [phone]

## 2024-05-01 NOTE — Progress Notes (Signed)
 RN left additional message to follow up regarding decision to not pursue treatment.

## 2024-05-01 NOTE — Progress Notes (Signed)
 "           Provider:  Dedra Gores, MD    Primary Care Physician:  [name], MD [address] [place] [address]     Referring Provider: [name], Md [address] [place],  [place] [address]          Chief Complaint according to patient   Patient presents with:     New Patient (Initial Visit)      Referral by dr Billy,  no depression, memory loss- impairment, sometimes paranoid. He still drives and wife can't say if he is safe to do it. She is however agreeing  that he couldn't manage fiances or communications, he misplaces items, he is confabulating. He is afraid that his car will be stolen, lost 2 Valets.   pt doesn't feel he has any memory changes. Spouse reports in the last 6 months, his memory, cognition, behavior has changed. He is very argumentative, non compliant, has trouble processing what is told/understanding - agitated,  but no physical aggression.       HISTORY OF PRESENT ILLNESS:  [name] is a 74 y.o. male patient who is seen upon [name], Md [address] [place],  [place] [address]'d referral on 05/01/2024  for an Evaluation of COGNITIVE CONCERNS .  I have the pleasure of seeing [name] on 05/01/2024, a right-handed male whose spouse  reports difficulties with  memory, orientation, wordfinding.  The following examples were provided:  confabulation, misplacing items, accusing others, no insight in his limitations, poor judgement in daily decisions. He asked his wife where the lady was that sleeps in this room, he has visions of another person impersonating his wife, he has acted out dreams.   Chief concern ( according to patient)  :  He has none - he is answering questions very circumferential or not all all. Speaks about his sex life, his phone, his car - not acknowledging any deficits.  He is very defensive.  Wife seems exhausted.    Med History: There have been no traumatic brain injuries, no  prolonged surgeries under general anesthesia, prostate  no radiation nor chemotherapy,  no history of substance abuse, or mental illness ( depression , but not major) .  ( Reviewed review of  any evidence  or documentation of : no PTSD, no Trauma such as TBI/ whiplash, no Autoimmune disorders, cancer, Vitamin deficiency,  Anemia, no malabsorption,  no GERD,  no Thyroid  disease or other endocrinological disorders, no Substance Abuse, Mood disorders, Depression.) The patient has adequate hearing and vision ability. No loss of smell.    Current medications : none - he d/c statins without consulting with the prescriber.  Social history: Mr [name]'s Family status is married,  and lives in a private  household with spouse .  The daily routines are well established, meal times, bedtime, rise time.     [name] is reporting independence in the following activities of daily living: activities of daily living : all of them , wife  differs:  The patient drives/ or member of the household drives: yes, he unfortunately has no insight in his illness.  He  has access to fresh food in the home, and eats regular meals.  Meals are prepared by the patient or in the household. The patient eats out. The patient eats  some processed food : fast food or canned food, frozen meals.  He never  has been shopping, cooking, doing laundry, and has no medication to take,  he can't use email anymore, has misplaced countless wallets and phones.   Communication: according to his wife , he is unable to use a mobile phone, or a computer. Passwords or PINs were forgotten He does not makes appointments by phone or online and  wife is the one to make sure he keeps appointments.  He reports independence in bill paying, banking, and files paperwork such as for insurance or taxes, but this is diputed by his wife, who  has handled these activities for over a year.     Family history : Impaired cognitive function or dementia were  affecting the following biological  family members:    Mother and father , both died at age 45, memory loss was  identified as AD. Onset at about  66-47 years of age, 12 years earlier.    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 .  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  Patient was working daytime-  The occupational environment included exposure to metal , not toxins- There was no financial planner time. [name] has one adult  daughter , one  grandchild. Support nearby provided by said daughter.     Nicotine use: briefly, may be for 5 years 2 cig a day and quit 40 plus years ago   ETOH use :  daily - moderate- but  when going out with friends he may drink 2-3 drinks.  Substance use otherwise: none  Caffeine intake in form of Coffee( /) Soda( cola some times ) Tea 2/3 gallon a day  ( bought iced , [employer] ) no energy drinks.  Physical activity in form of : Yard work.   Hobbies and Social activities:  he restores and maintains classic cars.    The referring physician has kindly provided the following clinical history information and evolution of symptoms , imaging  and test results:   MMSE;    05/01/2024   10:44 AM  MMSE - Mini Mental State Exam  Orientation to time 1  Orientation to Place 3  Registration 3  Attention/ Calculation 1  Recall 0  Language- name 2 objects 2  Language- repeat 1  Language- follow 3 step command 2  Language- read & follow direction 1  Write a sentence 1  Copy design 0  Total score 15     Labs all normal TSH, CBC, CMET, only cholesterol was high.   MOCA  : deferred , too low a score on MMSE and too affected in all ADLs.   CT head: none   MRI brain: reportedly normal      Review of Systems: Out of a complete 14 system review, the patient complains of only the following symptoms, and all other reviewed systems are negative.:  See above    Poor  judgement, limited insight.  High danger. Should not drive !    Social History   Socioeconomic History   Marital status: Married    Spouse name: Not on file   Number of children: Not on file   Years of education:  Not on file   Highest education level: Not on file  Occupational History   Not on file  Tobacco Use   Smoking status: Never   Smokeless tobacco: Never  Vaping Use   Vaping status: Never Used  Substance and Sexual Activity   Alcohol use: Yes    Comment: occasionally   Drug use: No   Sexual activity: Not Currently  Other Topics Concern   Not on file  Social History Narrative   Not on file   Social Drivers of Health   Tobacco Use: Low Risk (05/01/2024)   Patient History    Smoking Tobacco Use: Never    Smokeless Tobacco Use: Never    Passive Exposure: Not on file  Financial Resource Strain: Not on file  Food Insecurity: No Food Insecurity (03/27/2024)   Epic    Worried About Programme Researcher, Broadcasting/film/video in the Last Year: Never true    Ran Out of Food in the Last Year: Never true  Transportation Needs: No Transportation Needs (03/27/2024)   Epic    Lack of Transportation (Medical): No    Lack of Transportation (Non-Medical): No  Physical Activity: Not on file  Stress: Not on file  Social Connections: Not on file  Depression (PHQ2-9): Low Risk (03/27/2024)   Depression (PHQ2-9)    PHQ-2 Score: 0  Alcohol Screen: Low Risk (03/27/2024)   Alcohol Screen    Last Alcohol Screening Score (AUDIT): 1  Housing: Low Risk (03/27/2024)   Epic    Unable to Pay for Housing in the Last Year: No    Number of Times Moved in the Last Year: 0    Homeless in the Last Year: No  Utilities:  Not At Risk (03/27/2024)   Epic    Threatened with loss of utilities: No  Health Literacy: Not on file    Family History  Problem Relation Age of Onset   Diabetes Mother     Past Medical History:  Diagnosis Date   Dementia (HCC)    Elevated PSA    Hypercholesterolemia    Prostate cancer [facility])     Past Surgical History:  Procedure Laterality Date   LYMPHADENECTOMY Bilateral 06/29/2019   Procedure: LYMPHADENECTOMY, PELVIC;  Surgeon: Renda Glance, MD;  Location: [facility];  Service: Urology;  Laterality: Bilateral;   PROSTATE BIOPSY     ROBOT ASSISTED LAPAROSCOPIC RADICAL PROSTATECTOMY N/A 06/29/2019   Procedure: XI ROBOTIC ASSISTED LAPAROSCOPIC RADICAL PROSTATECTOMY LEVEL 2;  Surgeon: Renda Glance, MD;  Location: [facility];  Service: Urology;  Laterality: N/A;   TONSILLECTOMY       Medications Ordered Prior to Encounter[1]  Allergies[2]   DIAGNOSTIC DATA (LABS, IMAGING, TESTING) - I reviewed patient records, labs, notes, testing and imaging myself where available.  Lab Results  Component Value Date   WBC 6.3 06/26/2019   HGB 12.5 (L) 06/30/2019   HCT 38.4 (L) 06/30/2019   MCV 91.1 06/26/2019   PLT 233 06/26/2019      Component Value Date/Time   NA 142 06/26/2019 1101   K 4.1 06/26/2019 1101   CL 107 06/26/2019 1101   CO2 29 06/26/2019 1101   GLUCOSE 116 (H) 06/26/2019 1101   BUN 11 06/26/2019 1101   CREATININE 1.08 06/26/2019 1101   CALCIUM 9.1 06/26/2019 1101   GFRNONAA >60 06/26/2019 1101   GFRAA >60 06/26/2019 1101   No results found for: CHOL, HDL, LDLCALC, LDLDIRECT, TRIG, CHOLHDL No results found for: YHAJ8R No results found for: VITAMINB12 No results found for:  TSH  PHYSICAL EXAM:  Tabular:  No data found.   Body mass index is 22.3 kg/m.   Wt Readings from Last 3 Encounters:  05/01/24 134 lb (60.8 kg)  03/27/24 131 lb (59.4 kg)  12/23/21 139 lb 3.2 oz (63.1 kg)     Ht Readings from Last 3 Encounters:  05/01/24 5' 5  (1.651 m)  03/27/24 5' 7.4 (1.712 m)  12/23/21 5' 7 (1.702 m)      Cardiovascular:  Regular rate and cardiac rhythm by pulse,  without distended neck veins. Respiratory: no tachypnoea or wheezing. .  Skin:  Without evidence of ankle edema, rash, bruising  The patient's posture was  erect.   NEUROLOGIC EXAM: The patient was awake and alert, oriented to place and time.   Attention span & concentration ability appeared normal.  Speech was fluent, without dysarthria, dysphonia or aphasia, he confabulates.     Cranial nerves:  There was no  loss of smell but the  taste reportedly impaired.   Pupils are round, equal in size and briskly reactive to light.  Funduscopic exam was deferred.  Extraocular movements in vertical and horizontal planes were intact and without nystagmus. (No Diplopia reported). Visual fields by finger perimetry are intact. Hearing was intact to soft voice.    Facial sensation intact ( fine touch).  Facial motor strength: Symmetric movement and tongue and uvula move midline.  Neck ROM: rotation, tilt and flexion /extension were observed,  shoulder shrug was symmetrical.    Motor exam:  Symmetric bulk, strength and ROM.   Muscle tone was  elevated bilaterally - biceps rigidity,  positive for cog- wheeling, but there was symmetric grip strength.   Sensory:  Fine touch and vibration were tested by tuning fork and intact.  Proprioception tested in the upper extremities was normal.   Coordination: The patient reported no problems with button closure and no changes to penmanship.   The Finger-to-nose maneuver was intact without evidence of ataxia, dysmetria or tremor.    Gait and station: Patient could rise unassisted from a seated position, without  bracing, and walked without assistive device.  Stance was of normal width. The patient turned with 3 steps, and he walked fast.  Toe and heel walk were deferred. No limp was noted.  Arm swing was preserved. The  patient's gait posture was erect.   Deep tendon reflexes: Upper extremities did show symmetric DTRs. Lower extremity DTRs were symmetric and  very brisk- crossed adductor, no clonus.  Babinski response was deferred.       Dear [name], Md [address] [place],  [place] [address],   Thank you for entrusting me with your patient's care.     ASSESSMENT  :   As you know, your patient  Mr Meschke , a 74 y.o. male  retired engineer, building services , who presented here on 05-01-2024  for an Evaluation of memory difficulties  upon your request. His wife accompanied him.   He was assessed with the following  : advanced memory loss,  moderate dementia stage,  confabulation and no insight nor capacity of judgement. He had a slow onset of cognitive symptoms at age 53-69 and over the last 8 months shown a much faster progression with paranoia.   He has increased reflexes, he has rigor on physical examination, he appears restless and defensive. He repeats questions over and over, ( 'so this is all just normal for my age , right ? ) and  when these are answered to the contrary he still insists that all is normal.   No loss of smell but reduced taste - he uses now extra salt, pepper and condiments on his food.  He drinks high amounts of sweet tea and this is store bought, not home made. He reportedly has manifested confabulations and hallucinations - imposter / other women/ someone stealing or intruding.   His MMSE was 15 (!!) / 30 points and he is confabulating during his interview and anamnesis.  Poor  judgement, limited insight.  High danger. Should not drive !      My Plan is to proceed with:    1) Lab : [facility] DEMENTIA PANEL  2) Lab : [facility] for biomarkers of AD  3) ordered lab : Late onset ALZHEIMER Genetic risk  4) ordered EEG for encephalopathy work up  5) MRI brain with and without , if not already done, ruling out vascular dementia.  6 ) Started Donepezil  (unless patient has  bradycardia , IBS or REM BD) at 5 mg for 90 days, then increase to 10 mg- if tolerated. 7) arranged for Referral to neuropsychology for cognitive testing. 8) Refer for vision and audiology testing if applicable.   The plan is to identify cognitive domains affected by changes, to rule out brain lesions or vascular abnormalities, to obtain testing of  AD bio-markers, genetic markers and , if applicable , consolidate these findings by a PET scan.  Serial testing  by [facility] or MMSE test is needed for all patients that are currently scoring in the subjective memory loss, MCI, or mild dementia category.  A referral for neuropsychological interview and testing battery  has been ordered.  Medication to help slow cognitive decline are available but may not be tolerated, these are available in oral and patch form and their use is not specific to a single form of neurodegenerative cognitive disorder. Aricept  was started at 5 mg daily.     I plan to follow up  through our NP or personally within 6 month. Caveat : Only early stages and mild stages of AD may qualify for infusion therapy, based on MOCA/MMSE score and risk status for brain bleeding (AIDA), Mr Griep does not represent this group.  He is confabulating and presented with visions of 'male guests in the home , he also believed that someone other than his wife was sleeping in their bedroom, an imposter. He may need an antipsychotic medication.    The origin of this patient's condition is still not defined , he may  require frequent monitoring and adjustments in the treatment plan, reflecting the ongoing complexity of care.  This provider is for the named interval time the continuing focal point for associated medical /neurological needs services for this condition. [facility] providers will return patients to primary care after diagnostic work up if they do not qualify for neurologic -only provided advanced therapies.    A total time of  62   minutes consistent of a part of face to face encounter , exam and interview,  and additional preparation time for chart review was spent. Additionally, the following were reviewed: Past medical records, past medical and surgical history, family and social background, as well as relevant laboratory results, imaging findings, and medical notes, where applicable.  At today's visit, we discussed treatment options, associated risk and benefits, and engage in counseling as needed: Including, but not limited to driving safety, home safety, the benefit of routines and activities.   Memory  strategies were provided in the patient education attachment.   This note was generated in part by using dictation software, and as a result, it may contain unintentional typos and errors.  Nevertheless, effort was made to accurately convey the pertinent aspects of the patient's visit.    Electronically signed by: Dedra Gores, MD 05/01/2024 11:13 AM  [facility] and [facility] Board certified by The [employer] of Sleep Medicine and Diplomate of the [employer] of Sleep Medicine. Board certified In Neurology through the ABPN, Fellow of the [employer] of Neurology. [email or web address].   Dementia caregiver Tips:    Tips for Healthy Brain Aging:   Read the MIND DIET book for nutritional information.  Regular physical activity and daylight exposure are important . This lifts the mood and entrains a circadian rhythm, leading to better sleep and frees up the mind.    Walking a minimum of  20 minutes a day , if possible outdoors, preferable in a park or nature area.  Indoor exercise such as stretching , yoga , stationary bike or stair master ( at the gym).  Read !  Keep up with daily events , newspaper, discuss the news.   Maintain or establish new hobbies , consider volunteering, consider becoming a member of a club, church or establish other community memberships and  engagements. Social interactions give purpose, joy and are interactive - Interaction is brain jogging!    We do not want you to drive - (Reconsider your driving ability-  refrain from driving when agitated , when confused and in poor visibility. We can refer you for a driving evaluation at your costs) .   Reconsider your home if you experience ambulatory difficulties or high fall risk : Do you live in a single storey ( ranch/ apartment) ?  If you live in a multistory residence, are all stairs equipped with solid handrails on both sides ?  Can any existing staircase be retrofitted with a stair lift . Do you need an entry ramp ?   A home that is suited to aging -in -space has low or no barriers to enter , to reach the lavatory  ( wide doors , high toilet seat, handrails), and a  no barrier shower ( no tub ) with a scold guard water  temperature setting, handheld shower head , and a freestanding shower seat ( consider a plastic garden armchair !) .    And are the doors wide enough to pass with walker or wheelchair?  Is there enough space in the bedroom to reach the bed from two or better three sides.   De- clutter and arrange your home for safety: place objects at eye height , not above- not below hip height-  you should not need to use a stepstool or ladder or bend deep down.    No overlapping area rugs, establish sufficient light, and passages of wider space free of furniture., remove breakable items from side tables, night stands.   A Kitchen stovetop by induction prevents injuries and fires- replace gas tops and toaster ovens.          [1]  No current outpatient medications on file prior to visit.   No current facility-administered medications on file prior to visit.  [2]  Allergies Allergen Reactions   Penicillins Rash    Did it involve swelling of the face/tongue/throat, SOB, or low BP? No Did it involve sudden or severe rash/hives, skin peeling, or any reaction on the inside of  your mouth or nose? No Did you need to seek medical attention at a hospital or doctor's office? No When did it last happen?       If all above answers are NO, may proceed with cephalosporin use.    Atorvastatin Calcium     Other Reaction(s): myalgias   "

## 2024-05-02 NOTE — Progress Notes (Signed)
 Patient was a recent Rad Onc consult on 12/8 for his biochemically recurrent prostate cancer with postoperative PSA of 0.3 s/p RALP 06/2019 for Stage pT2N0, Gleason 3+4 prostate cancer.  Patient was agreeable to proceed with 7.5 weeks of salvage radiation, however, has now decided not to pursue treatment.  Patient's wife reports patient was recently started on Aricpet and is hopeful husband will reconsider the treatment recommendations.  Wife has my contact information and knows to reach out with any questions or changes in decision.     Patient does have on going follow up's with urology.  RN will notified providers of final decision.

## 2024-05-03 ENCOUNTER — Ambulatory Visit: Payer: Self-pay | Admitting: Neurology

## 2024-05-03 ENCOUNTER — Ambulatory Visit: Admitting: Neurology

## 2024-05-03 DIAGNOSIS — R419 Unspecified symptoms and signs involving cognitive functions and awareness: Secondary | ICD-10-CM

## 2024-05-03 DIAGNOSIS — F22 Delusional disorders: Secondary | ICD-10-CM

## 2024-05-03 DIAGNOSIS — R4182 Altered mental status, unspecified: Secondary | ICD-10-CM | POA: Diagnosis not present

## 2024-05-03 DIAGNOSIS — G309 Alzheimer's disease, unspecified: Secondary | ICD-10-CM

## 2024-05-08 ENCOUNTER — Ambulatory Visit

## 2024-05-09 ENCOUNTER — Ambulatory Visit
Admission: RE | Admit: 2024-05-09 | Discharge: 2024-05-09 | Disposition: A | Source: Ambulatory Visit | Attending: Neurology | Admitting: Neurology

## 2024-05-09 ENCOUNTER — Ambulatory Visit

## 2024-05-09 DIAGNOSIS — G309 Alzheimer's disease, unspecified: Secondary | ICD-10-CM

## 2024-05-09 LAB — COMPREHENSIVE METABOLIC PANEL WITH GFR
ALT: 16 IU/L (ref 0–44)
AST: 22 IU/L (ref 0–40)
Albumin: 4.8 g/dL (ref 3.8–4.8)
Alkaline Phosphatase: 76 IU/L (ref 47–123)
BUN/Creatinine Ratio: 8 — ABNORMAL LOW (ref 10–24)
BUN: 8 mg/dL (ref 8–27)
Bilirubin Total: 1.2 mg/dL (ref 0.0–1.2)
CO2: 25 mmol/L (ref 20–29)
Calcium: 9.8 mg/dL (ref 8.6–10.2)
Chloride: 103 mmol/L (ref 96–106)
Creatinine, Ser: 1.01 mg/dL (ref 0.76–1.27)
Globulin, Total: 2.2 g/dL (ref 1.5–4.5)
Glucose: 97 mg/dL (ref 70–99)
Potassium: 4.5 mmol/L (ref 3.5–5.2)
Sodium: 143 mmol/L (ref 134–144)
Total Protein: 7 g/dL (ref 6.0–8.5)
eGFR: 79 mL/min/1.73

## 2024-05-09 LAB — CBC WITH DIFFERENTIAL/PLATELET
Basophils Absolute: 0.1 x10E3/uL (ref 0.0–0.2)
Basos: 1 %
EOS (ABSOLUTE): 0 x10E3/uL (ref 0.0–0.4)
Eos: 1 %
Hematocrit: 48.5 % (ref 37.5–51.0)
Hemoglobin: 16.2 g/dL (ref 13.0–17.7)
Immature Grans (Abs): 0 x10E3/uL (ref 0.0–0.1)
Immature Granulocytes: 0 %
Lymphocytes Absolute: 1.5 x10E3/uL (ref 0.7–3.1)
Lymphs: 25 %
MCH: 30.2 pg (ref 26.6–33.0)
MCHC: 33.4 g/dL (ref 31.5–35.7)
MCV: 90 fL (ref 79–97)
Monocytes Absolute: 0.3 x10E3/uL (ref 0.1–0.9)
Monocytes: 5 %
Neutrophils Absolute: 4.1 x10E3/uL (ref 1.4–7.0)
Neutrophils: 68 %
Platelets: 323 x10E3/uL (ref 150–450)
RBC: 5.37 x10E6/uL (ref 4.14–5.80)
RDW: 12.6 % (ref 11.6–15.4)
WBC: 6 x10E3/uL (ref 3.4–10.8)

## 2024-05-09 LAB — ATN PROFILE
A -- Beta-amyloid 42/40 Ratio: 0.087 — ABNORMAL LOW
Beta-amyloid 40: 237.33 pg/mL
Beta-amyloid 42: 20.59 pg/mL
N -- NfL, Plasma: 5.34 pg/mL (ref 0.00–6.04)
T -- p-tau181: 2.42 pg/mL — ABNORMAL HIGH (ref 0.00–0.97)

## 2024-05-09 LAB — HIV ANTIBODY (ROUTINE TESTING W REFLEX): HIV Screen 4th Generation wRfx: NONREACTIVE

## 2024-05-09 LAB — PROTEIN ELECTROPHORESIS, SERUM
A/G Ratio: 1.3 (ref 0.7–1.7)
Albumin ELP: 4 g/dL (ref 2.9–4.4)
Alpha 1: 0.2 g/dL (ref 0.0–0.4)
Alpha 2: 0.7 g/dL (ref 0.4–1.0)
Beta: 1.2 g/dL (ref 0.7–1.3)
Gamma Globulin: 0.9 g/dL (ref 0.4–1.8)
Globulin, Total: 3 g/dL (ref 2.2–3.9)

## 2024-05-09 LAB — APOE ALZHEIMER'S DISEASE RISK

## 2024-05-09 LAB — TSH+FREE T4
Free T4: 1.22 ng/dL (ref 0.82–1.77)
TSH: 1.73 u[IU]/mL (ref 0.450–4.500)

## 2024-05-09 LAB — SEDIMENTATION RATE: Sed Rate: 2 mm/h (ref 0–30)

## 2024-05-09 LAB — METHYLMALONIC ACID, SERUM: Methylmalonic Acid: 336 nmol/L (ref 0–378)

## 2024-05-09 LAB — VITAMIN B12: Vitamin B-12: 414 pg/mL (ref 232–1245)

## 2024-05-09 LAB — ANA W/REFLEX: Anti Nuclear Antibody (ANA): NEGATIVE

## 2024-05-09 LAB — SYPHILIS: RPR W/REFLEX TO RPR TITER AND TREPONEMAL ANTIBODIES, TRADITIONAL SCREENING AND DIAGNOSIS ALGORITHM: RPR Ser Ql: NONREACTIVE

## 2024-05-09 MED ORDER — GADOPICLENOL 0.5 MMOL/ML IV SOLN
6.0000 mL | Freq: Once | INTRAVENOUS | Status: AC | PRN
  Administered 2024-05-09: 6 mL via INTRAVENOUS

## 2024-05-10 ENCOUNTER — Ambulatory Visit

## 2024-05-11 ENCOUNTER — Ambulatory Visit

## 2024-05-12 ENCOUNTER — Ambulatory Visit

## 2024-05-15 ENCOUNTER — Ambulatory Visit

## 2024-05-15 ENCOUNTER — Encounter: Payer: Self-pay | Admitting: Neurology

## 2024-05-16 ENCOUNTER — Ambulatory Visit

## 2024-05-16 NOTE — Procedures (Signed)
 [facility]  EEG (ELECTROENCEPHALOGRAM) REPORT     ORDERING CLINICIAN: Dedra Gores, M.D.  TECHNOLOGIST: ,K. Suitor REEGT TECHNIQUE:  This EEG study was performed according to the 10-20 International system of electrode placement. Electrical activity was reviewed with band pass filter of 1-70Hz , sensitivity of 7 uV/mm, display speed of 45mm/sec with a 60Hz  notched filter applied as appropriate. EEG data were recorded continuously and digitally stored.    Recording duration : 25;11 minutes Activation included:  Photic stimulation and Hyperventilation  .      Description: The EEG's posterior dominant background rhythm of 7.5 hertz was symmetrically displayed while the patient's eyes were closed  and attenuated with eye opening. At baseline, the recording showed a moderate amplitude in symmetric fashion.  Photic stimulation was initiated at frequencies from 3- through 21 hertz, resulting in photic entrainment at  3-9 hertz ,  without periodic or rhythmic discharges, or epileptiform activity.  Hyperventilation maneuver was initiated,  leading to amplitude build-up, and slowed the background rhythm to 7 hertz .  Following hyperventilation the patient's EEG was reviewed for a period of 1 and 2 minutes post maneuver. Towards the end of the study the patient called out :  call the police .    ECG: regular  heart rate at  62 bpm., presumed sinus rhythm.   IMPRESSION:  This EEG is abnormally slow and but didn't show epileptiform activity. A slow background rhythm is commonly seen with neurocognitive disorders( dementia ) and can be seen under sedative medications.   Dr. Dedra Gores, M.D. Accredited by the ABPN, ABSM.

## 2024-05-17 ENCOUNTER — Ambulatory Visit

## 2024-05-18 ENCOUNTER — Ambulatory Visit

## 2024-05-19 ENCOUNTER — Ambulatory Visit

## 2024-05-22 ENCOUNTER — Ambulatory Visit

## 2024-05-23 ENCOUNTER — Ambulatory Visit

## 2024-05-24 ENCOUNTER — Ambulatory Visit

## 2024-05-25 ENCOUNTER — Ambulatory Visit

## 2024-05-26 ENCOUNTER — Ambulatory Visit

## 2024-05-29 ENCOUNTER — Ambulatory Visit

## 2024-05-30 ENCOUNTER — Ambulatory Visit

## 2024-05-31 ENCOUNTER — Ambulatory Visit

## 2024-06-01 ENCOUNTER — Ambulatory Visit

## 2024-06-02 ENCOUNTER — Ambulatory Visit

## 2024-06-05 ENCOUNTER — Ambulatory Visit

## 2024-06-06 ENCOUNTER — Ambulatory Visit

## 2024-06-07 ENCOUNTER — Ambulatory Visit

## 2024-06-08 ENCOUNTER — Ambulatory Visit

## 2024-06-09 ENCOUNTER — Ambulatory Visit

## 2024-06-12 ENCOUNTER — Ambulatory Visit

## 2024-06-13 ENCOUNTER — Ambulatory Visit

## 2024-06-14 ENCOUNTER — Ambulatory Visit

## 2024-06-15 ENCOUNTER — Ambulatory Visit

## 2024-06-16 ENCOUNTER — Ambulatory Visit

## 2024-06-19 ENCOUNTER — Ambulatory Visit

## 2024-06-20 ENCOUNTER — Ambulatory Visit

## 2024-06-21 ENCOUNTER — Ambulatory Visit

## 2024-06-22 ENCOUNTER — Ambulatory Visit

## 2024-06-23 ENCOUNTER — Ambulatory Visit

## 2024-06-26 ENCOUNTER — Ambulatory Visit

## 2024-06-27 ENCOUNTER — Ambulatory Visit

## 2024-06-28 ENCOUNTER — Ambulatory Visit

## 2024-10-31 ENCOUNTER — Ambulatory Visit: Admitting: Neurology
